# Patient Record
Sex: Male | Born: 1940 | Race: White | Hispanic: No | Marital: Married | Smoking: Former smoker
Health system: Southern US, Community
[De-identification: ages and names within clinical notes are randomized; demographics above are authoritative.]

## PROBLEM LIST (undated history)

## (undated) DIAGNOSIS — Z972 Presence of dental prosthetic device (complete) (partial): Secondary | ICD-10-CM

## (undated) DIAGNOSIS — I493 Ventricular premature depolarization: Secondary | ICD-10-CM

## (undated) DIAGNOSIS — Z8489 Family history of other specified conditions: Secondary | ICD-10-CM

## (undated) DIAGNOSIS — I4892 Unspecified atrial flutter: Secondary | ICD-10-CM

## (undated) DIAGNOSIS — I4510 Unspecified right bundle-branch block: Secondary | ICD-10-CM

## (undated) DIAGNOSIS — M1990 Unspecified osteoarthritis, unspecified site: Secondary | ICD-10-CM

## (undated) DIAGNOSIS — N400 Enlarged prostate without lower urinary tract symptoms: Secondary | ICD-10-CM

## (undated) DIAGNOSIS — G2581 Restless legs syndrome: Secondary | ICD-10-CM

## (undated) DIAGNOSIS — K219 Gastro-esophageal reflux disease without esophagitis: Secondary | ICD-10-CM

## (undated) HISTORY — DX: Unspecified atrial flutter: I48.92

## (undated) HISTORY — DX: Unspecified right bundle-branch block: I45.10

## (undated) HISTORY — PX: COLONOSCOPY: SHX174

## (undated) HISTORY — DX: Ventricular premature depolarization: I49.3

## (undated) HISTORY — DX: Benign prostatic hyperplasia without lower urinary tract symptoms: N40.0

## (undated) HISTORY — DX: Restless legs syndrome: G25.81

---

## 2004-12-16 ENCOUNTER — Ambulatory Visit: Payer: Self-pay | Admitting: Unknown Physician Specialty

## 2005-12-01 ENCOUNTER — Ambulatory Visit: Payer: Self-pay | Admitting: General Surgery

## 2005-12-01 HISTORY — PX: HERNIA REPAIR: SHX51

## 2008-06-04 ENCOUNTER — Ambulatory Visit: Payer: Self-pay | Admitting: Unknown Physician Specialty

## 2008-09-07 ENCOUNTER — Ambulatory Visit: Payer: Self-pay | Admitting: Internal Medicine

## 2009-05-08 ENCOUNTER — Ambulatory Visit: Payer: Self-pay | Admitting: Urology

## 2010-12-24 IMAGING — US US RENAL KIDNEY
1 series · 17 of 25 positions shown · non-contrast
Comparison: none

REASON FOR EXAM: UTI
COMMENTS:

[Series 1: us renal kidney · 17 of 25 slices shown]
[im 1/25]
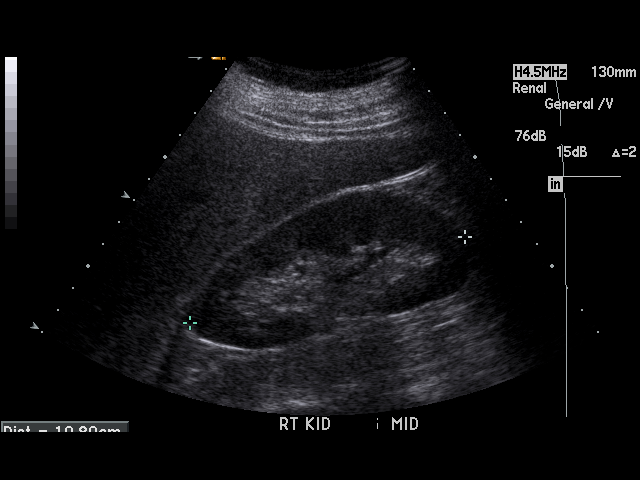
[im 3/25]
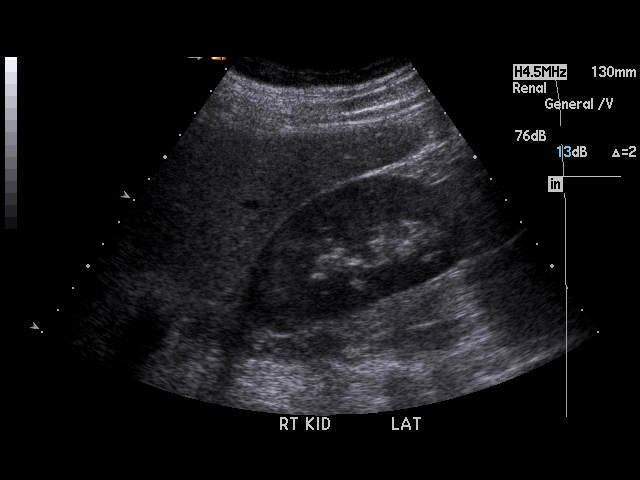
[im 4/25]
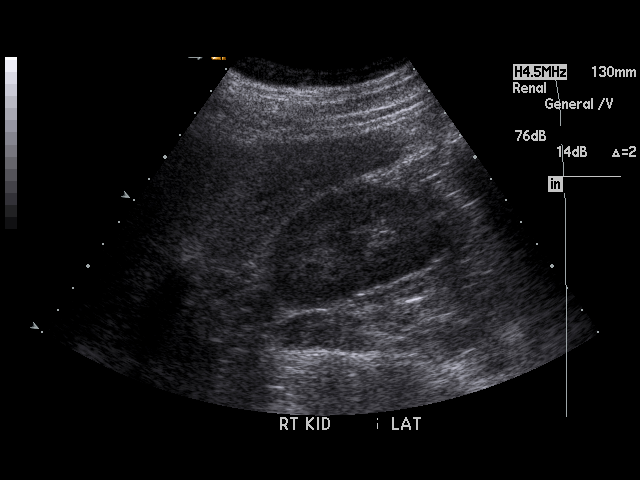
[im 6/25]
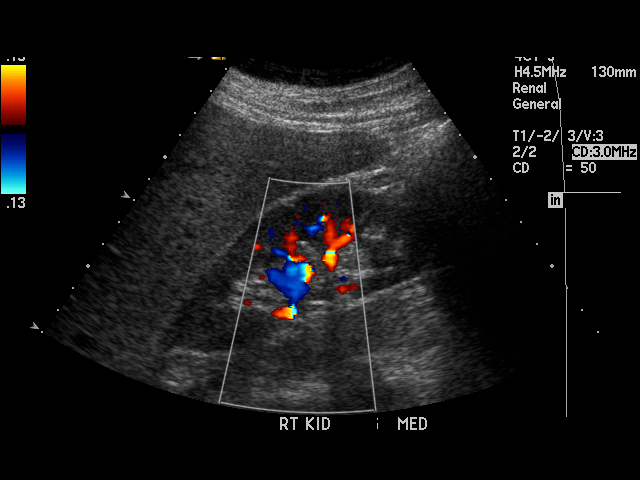
[im 7/25]
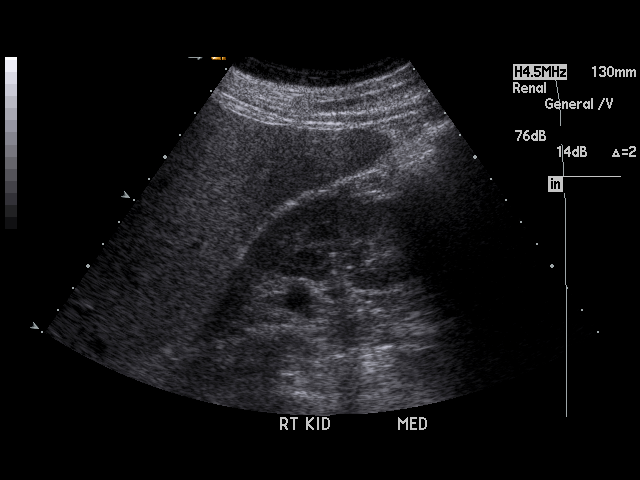
[im 9/25]
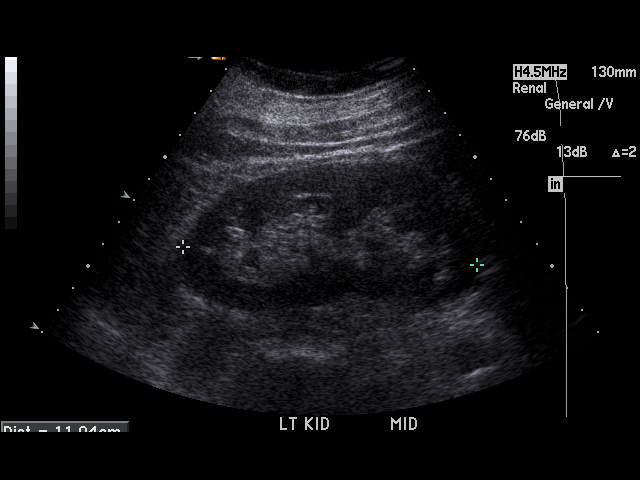
[im 10/25]
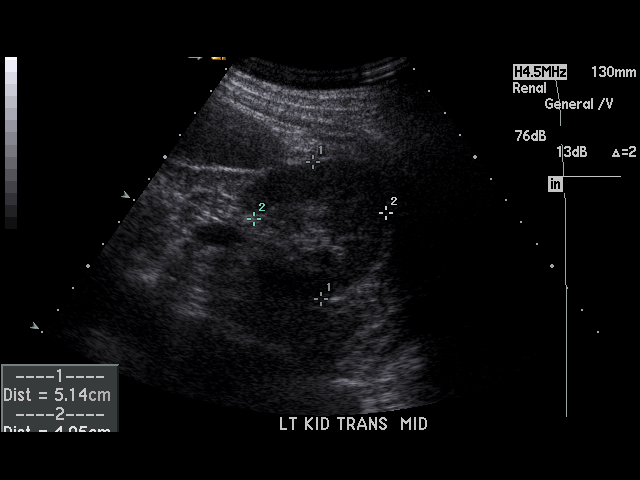
[im 12/25]
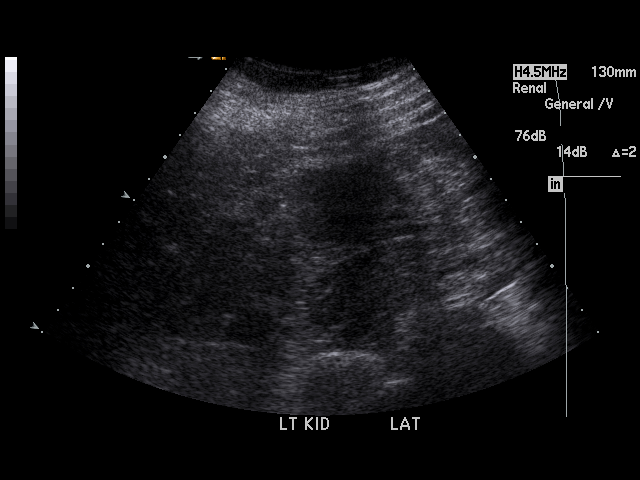
[im 13/25]
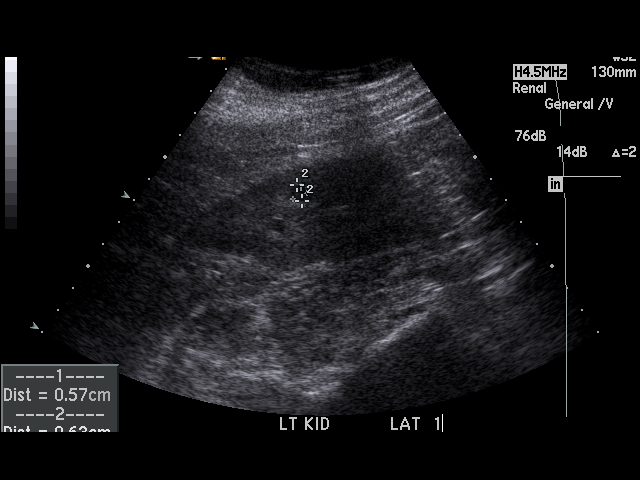
[im 14/25]
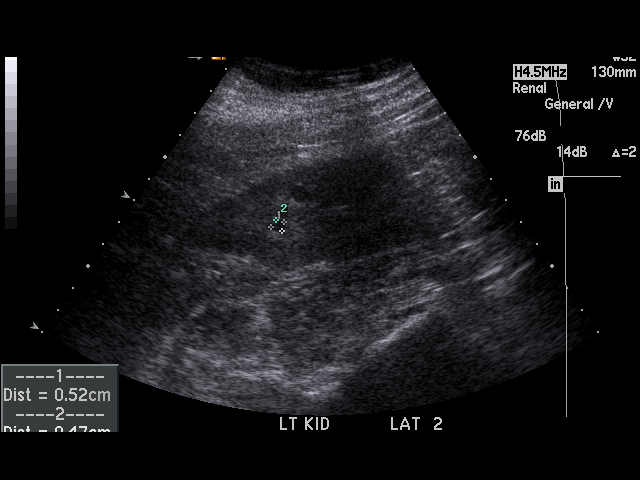
[im 16/25]
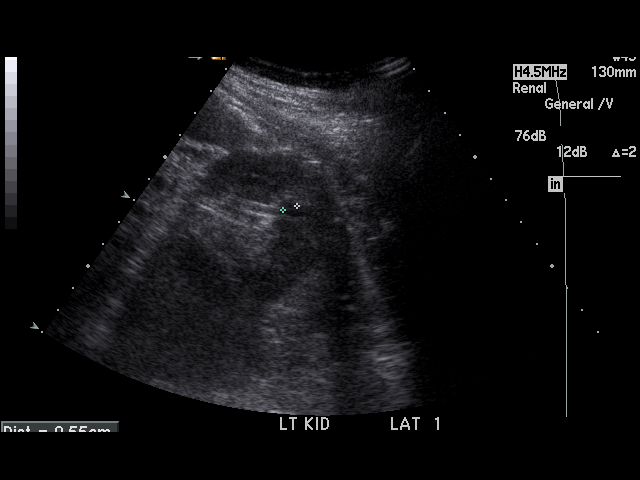
[im 17/25]
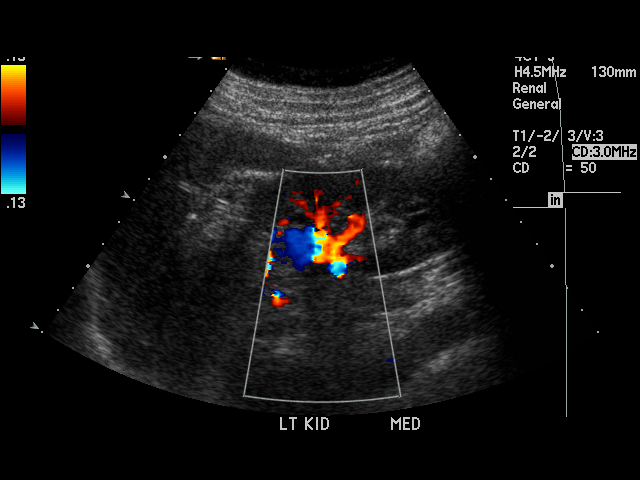
[im 19/25]
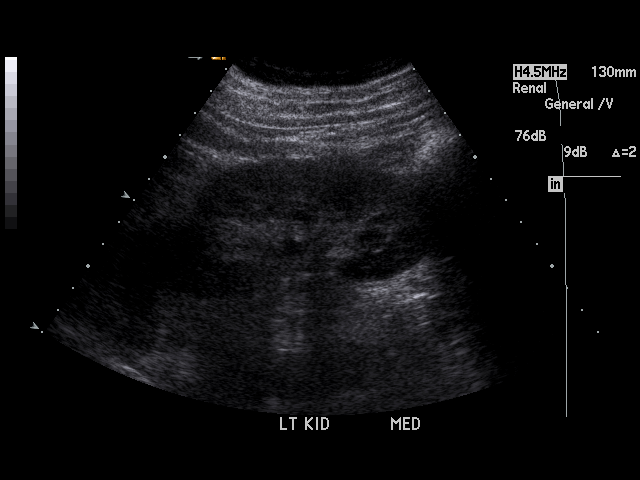
[im 20/25]
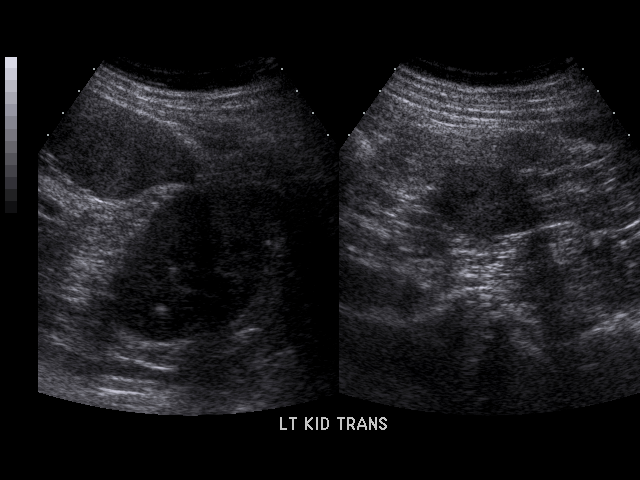
[im 22/25]
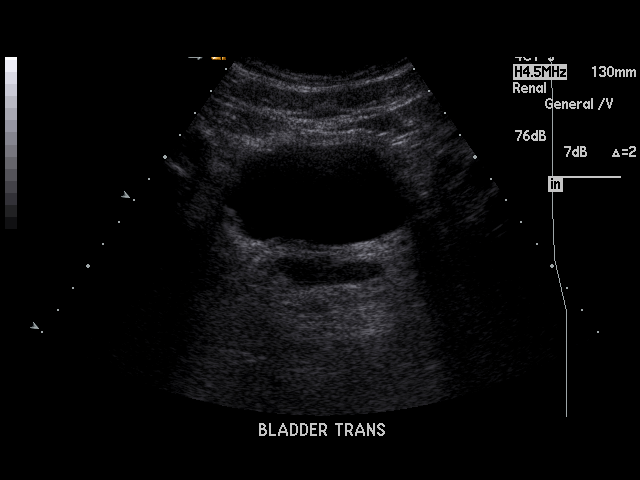
[im 23/25]
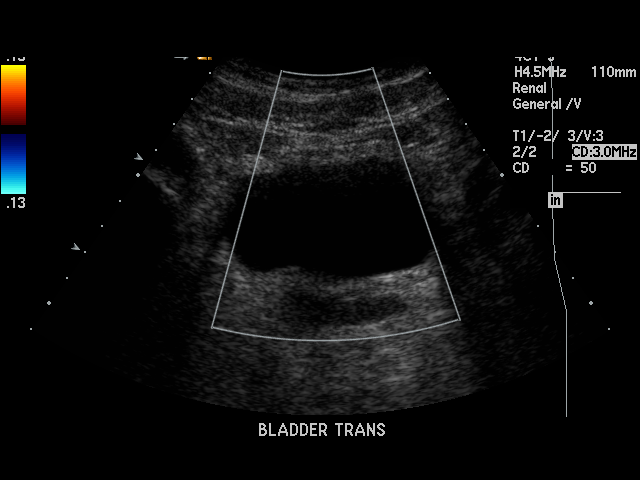
[im 25/25]
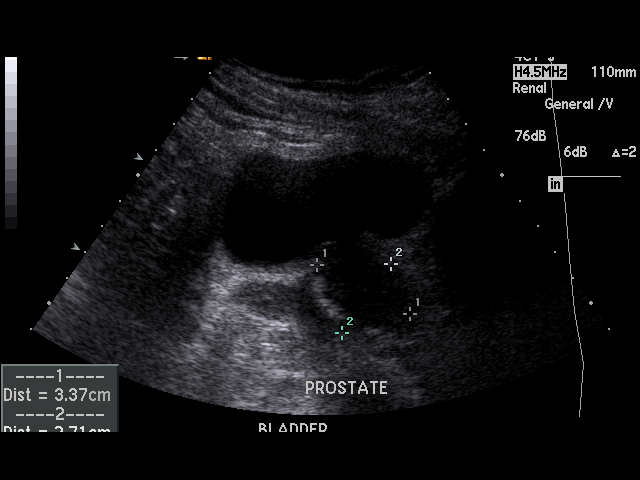

[17 of 25 positions shown; findings below may reference images not displayed]

PROCEDURE:     US  - US KIDNEY  - May 08, 2009 [DATE]

RESULT:     The right kidney measures 10.8 cm x 4.81 cm x 5.62 cm and the
left kidney measures 11.04 cm x 5.14 cm x 4.95 cm. The renal cortical
margins are bilaterally smooth. There are observed two, tiny cysts of the
left kidney with the larger measuring 7.5 mm in diameter. No solid renal
mass lesions are seen on either side. No renal calcifications are observed.
There is no hydronephrosis. The visualized portion of the urinary bladder is
normal in appearance. There is slight indentation of the base of the urinary
bladder by a mildly prominent prostate which measures 3.75 cm x 3.37 cm x
2.71 cm.
IMPRESSION: 1.  No hydronephrosis or other acute change is identified.
2.  Incidental note is made of a few tiny cysts of the left kidney.
3.  There is slight indentation of the base of the bladder by the prostate
which measures 3.75 cm at maximum diameter.
4.  The visualized portion of the urinary bladder otherwise is normal in
appearance.

## 2011-10-05 ENCOUNTER — Ambulatory Visit: Payer: Self-pay | Admitting: Unknown Physician Specialty

## 2011-10-08 LAB — PATHOLOGY REPORT

## 2012-06-29 DIAGNOSIS — R972 Elevated prostate specific antigen [PSA]: Secondary | ICD-10-CM | POA: Insufficient documentation

## 2014-06-06 DIAGNOSIS — M1991 Primary osteoarthritis, unspecified site: Secondary | ICD-10-CM | POA: Insufficient documentation

## 2014-12-30 ENCOUNTER — Encounter: Payer: Self-pay | Admitting: *Deleted

## 2015-01-09 NOTE — Discharge Instructions (Signed)
[facility] °[facility] °ENDOSCOPIC SINUS SURGERY °[facility] ° °What is Functional Endoscopic Sinus Surgery? ° The Surgery involves making the natural openings of the sinuses larger by removing the bony partitions that separate the sinuses from the nasal cavity.  The natural sinus lining is preserved as much as possible to allow the sinuses to resume normal function after the surgery.  In some patients nasal polyps (excessively swollen lining of the sinuses) may be removed to relieve obstruction of the sinus openings.  The surgery is performed through the nose using lighted scopes, which eliminates the need for incisions on the face.  A septoplasty is a different procedure which is sometimes performed with sinus surgery.  It involves straightening the boy partition that separates the two sides of your nose.  A crooked or deviated septum may need repair if is obstructing the sinuses or nasal airflow.  Turbinate reduction is also often performed during sinus surgery.  The turbinates are bony proturberances from the side walls of the nose which swell and can obstruct the nose in patients with sinus and allergy problems.  Their size can be surgically reduced to help relieve nasal obstruction. ° °What Can Sinus Surgery Do For Me? ° Sinus surgery can reduce the frequency of sinus infections requiring antibiotic treatment.  This can provide improvement in nasal congestion, post-nasal drainage, facial pressure and nasal obstruction.  Surgery will NOT prevent you from ever having an infection again, so it usually only for patients who get infections 4 or more times yearly requiring antibiotics, or for infections that do not clear with antibiotics.  It will not cure nasal allergies, so patients with allergies may still require medication to treat their allergies after surgery. Surgery may improve headaches related to sinusitis, however, some people will continue to  require medication to control sinus headaches related to allergies.  Surgery will do nothing for other forms of headache (migraine, tension or cluster). ° °What Are the Risks of Endoscopic Sinus Surgery? ° Current techniques allow surgery to be performed safely with little risk, however, there are rare complications that patients should be aware of.  Because the sinuses are located around the eyes, there is risk of eye injury, including blindness, though again, this would be quite rare. This is usually a result of bleeding behind the eye during surgery, which puts the vision oat risk, though there are treatments to protect the vision and prevent permanent disrupted by surgery causing a leak of the spinal fluid that surrounds the brain.  More serious complications would include bleeding inside the brain cavity or damage to the brain.  Again, all of these complications are uncommon, and spinal fluid leaks can be safely managed surgically if they occur.  The most common complication of sinus surgery is bleeding from the nose, which may require packing or cauterization of the nose.  Continued sinus have polyps may experience recurrence of the polyps requiring revision surgery.  Alterations of sense of smell or injury to the tear ducts are also rare complications.  ° °What is the Surgery Like, and what is the Recovery? ° The Surgery usually takes a couple of hours to perform, and is usually performed under a general anesthetic (completely asleep).  Patients are usually discharged home after a couple of hours.  Sometimes during surgery it is necessary to pack the nose to control bleeding, and the packing is left in place for 24 - 48 hours, and removed by your surgeon.    If a septoplasty was performed during the procedure, there is often a splint placed which must be removed after 5-7 days.   °Discomfort: Pain is usually mild to moderate, and can be controlled by prescription pain medication or acetaminophen (Tylenol).   Aspirin, Ibuprofen (Advil, Motrin), or Naprosyn (Aleve) should be avoided, as they can cause increased bleeding.  Most patients feel sinus pressure like they have a bad head cold for several days.  Sleeping with your head elevated can help reduce swelling and facial pressure, as can ice packs over the face.  A humidifier may be helpful to keep the mucous and blood from drying in the nose.  ° °Diet: There are no specific diet restrictions, however, you should generally start with clear liquids and a light diet of bland foods because the anesthetic can cause some nausea.  Advance your diet depending on how your stomach feels.  Taking your pain medication with food will often help reduce stomach upset which pain medications can cause. ° °Nasal Saline Irrigation: It is important to remove blood clots and dried mucous from the nose as it is healing.  This is done by having you irrigate the nose at least 3 - 4 times daily with a salt water solution.  We recommend using NeilMed Sinus Rinse (available at the drug store).  Fill the squeeze bottle with the solution, bend over a sink, and insert the tip of the squeeze bottle into the nose ½ of an inch.  Point the tip of the squeeze bottle towards the inside corner of the eye on the same side your irrigating.  Squeeze the bottle and gently irrigate the nose.  If you bend forward as you do this, most of the fluid will flow back out of the nose, instead of down your throat.   The solution should be warm, near body temperature, when you irrigate.   Each time you irrigate, you should use a full squeeze bottle.  ° °Note that if you are instructed to use Nasal Steroid Sprays at any time after your surgery, irrigate with saline BEFORE using the steroid spray, so you do not wash it all out of the nose. °Another product, Nasal Saline Gel (such as AYR Nasal Saline Gel) can be applied in each nostril 3 - 4 times daily to moisture the nose and reduce scabbing or crusting. ° °Bleeding:   Bloody drainage from the nose can be expected for several days, and patients are instructed to irrigate their nose frequently with salt water to help remove mucous and blood clots.  The drainage may be dark red or brown, though some fresh blood may be seen intermittently, especially after irrigation.  Do not blow you nose, as bleeding may occur. If you must sneeze, keep your mouth open to allow air to escape through your mouth. ° °If heavy bleeding occurs: Irrigate the nose with saline to rinse out clots, then spray the nose 3 - 4 times with Afrin Nasal Decongestant Spray.  The spray will constrict the blood vessels to slow bleeding.  Pinch the lower half of your nose shut to apply pressure, and lay down with your head elevated.  Ice packs over the nose may help as well. If bleeding persists despite these measures, you should notify your doctor.  Do not use the Afrin routinely to control nasal congestion after surgery, as it can result in worsening congestion and may affect healing.  ° ° ° °Activity: Return to work varies among patients. Most patients will be   out of work at least 5 - 7 days to recover.  Patient may return to work after they are off of narcotic pain medication, and feeling well enough to perform the functions of their job.  Patients must avoid heavy lifting (over 10 pounds) or strenuous physical for 2 weeks after surgery, so your employer may need to assign you to light duty, or keep you out of work longer if light duty is not possible.  NOTE: you should not drive, operate dangerous machinery, do any mentally demanding tasks or make any important legal or financial decisions while on narcotic pain medication and recovering from the general anesthetic.  °  °Call Your Doctor Immediately if You Have Any of the Following: °1. Bleeding that you cannot control with the above measures °2. Loss of vision, double vision, bulging of the eye or black eyes. °3. Fever over 101 degrees °4. Neck stiffness with  severe headache, fever, nausea and change in mental state. °You are always encourage to call anytime with concerns, however, please call with requests for pain medication refills during office hours. ° °Office Endoscopy: During follow-up visits your doctor will remove any packing or splints that may have been placed and evaluate and clean your sinuses endoscopically.  Topical anesthetic will be used to make this as comfortable as possible, though you may want to take your pain medication prior to the visit.  How often this will need to be done varies from patient to patient.  After complete recovery from the surgery, you may need follow-up endoscopy from time to time, particularly if there is concern of recurrent infection or nasal polyps. ° °General Anesthesia, Adult, Care After °Refer to this sheet in the next few weeks. These instructions provide you with information on caring for yourself after your procedure. Your health care provider may also give you more specific instructions. Your treatment has been planned according to current medical practices, but problems sometimes occur. Call your health care provider if you have any problems or questions after your procedure. °WHAT TO EXPECT AFTER THE PROCEDURE °After the procedure, it is typical to experience: °· Sleepiness. °· Nausea and vomiting. °HOME CARE INSTRUCTIONS °· For the first 24 hours after general anesthesia: °¨ Have a responsible person with you. °¨ Do not drive a car. If you are alone, do not take public transportation. °¨ Do not drink alcohol. °¨ Do not take medicine that has not been prescribed by your health care provider. °¨ Do not sign important papers or make important decisions. °¨ You may resume a normal diet and activities as directed by your health care provider. °· Change bandages (dressings) as directed. °· If you have questions or problems that seem related to general anesthesia, call the hospital and ask for the anesthetist or  anesthesiologist on call. °SEEK MEDICAL CARE IF: °· You have nausea and vomiting that continue the day after anesthesia. °· You develop a rash. °SEEK IMMEDIATE MEDICAL CARE IF:  °· You have difficulty breathing. °· You have chest pain. °· You have any allergic problems. °  °This information is not intended to replace advice given to you by your health care provider. Make sure you discuss any questions you have with your health care provider. °  °Document Released: 05/03/2000 Document Revised: 02/15/2014 Document Reviewed: 05/26/2011 °Elsevier Interactive Patient Education ©2016 [employer] ° °

## 2015-01-10 ENCOUNTER — Ambulatory Visit: Payer: Medicare Other | Admitting: Anesthesiology

## 2015-01-10 ENCOUNTER — Encounter
Admission: RE | Disposition: A | Discharge status: Home / Self Care | Payer: Self-pay | Source: Ambulatory Visit | Attending: Unknown Physician Specialty

## 2015-01-10 ENCOUNTER — Ambulatory Visit
Admission: RE | Admit: 2015-01-10 | Discharge: 2015-01-10 | Disposition: A | Discharge status: Home / Self Care | Payer: Medicare Other | Source: Ambulatory Visit | Attending: Unknown Physician Specialty | Admitting: Unknown Physician Specialty

## 2015-01-10 DIAGNOSIS — K219 Gastro-esophageal reflux disease without esophagitis: Secondary | ICD-10-CM | POA: Insufficient documentation

## 2015-01-10 DIAGNOSIS — Z87891 Personal history of nicotine dependence: Secondary | ICD-10-CM | POA: Diagnosis not present

## 2015-01-10 DIAGNOSIS — J343 Hypertrophy of nasal turbinates: Secondary | ICD-10-CM | POA: Diagnosis not present

## 2015-01-10 DIAGNOSIS — Z79899 Other long term (current) drug therapy: Secondary | ICD-10-CM | POA: Insufficient documentation

## 2015-01-10 DIAGNOSIS — M1990 Unspecified osteoarthritis, unspecified site: Secondary | ICD-10-CM | POA: Insufficient documentation

## 2015-01-10 DIAGNOSIS — J342 Deviated nasal septum: Secondary | ICD-10-CM | POA: Diagnosis not present

## 2015-01-10 DIAGNOSIS — J3489 Other specified disorders of nose and nasal sinuses: Secondary | ICD-10-CM | POA: Insufficient documentation

## 2015-01-10 HISTORY — PX: SEPTOPLASTY: SHX2393

## 2015-01-10 HISTORY — PX: NASAL TURBINATE REDUCTION: SHX2072

## 2015-01-10 HISTORY — DX: Unspecified osteoarthritis, unspecified site: M19.90

## 2015-01-10 HISTORY — DX: Presence of dental prosthetic device (complete) (partial): Z97.2

## 2015-01-10 HISTORY — DX: Gastro-esophageal reflux disease without esophagitis: K21.9

## 2015-01-10 SURGERY — SEPTOPLASTY, NOSE
Anesthesia: General | Site: Nose | Wound class: Clean Contaminated

## 2015-01-10 MED ORDER — ONDANSETRON HCL 4 MG/2ML IJ SOLN
INTRAMUSCULAR | Status: DC | PRN
  Administered 2015-01-10: 4 mg via INTRAVENOUS

## 2015-01-10 MED ORDER — PHENYLEPHRINE HCL 0.5 % NA SOLN
NASAL | Status: DC | PRN
  Administered 2015-01-10: 30 mL via TOPICAL

## 2015-01-10 MED ORDER — EPHEDRINE SULFATE 50 MG/ML IJ SOLN
INTRAMUSCULAR | Status: DC | PRN
  Administered 2015-01-10 (×4): 5 mg via INTRAVENOUS

## 2015-01-10 MED ORDER — OXYCODONE HCL 5 MG PO TABS: 5.0000 mg | ORAL_TABLET | Freq: Once | ORAL | Status: DC | PRN

## 2015-01-10 MED ORDER — ONDANSETRON HCL 4 MG/2ML IJ SOLN: 4.0000 mg | Freq: Once | INTRAMUSCULAR | Status: DC | PRN

## 2015-01-10 MED ORDER — MIDAZOLAM HCL 5 MG/5ML IJ SOLN
INTRAMUSCULAR | Status: DC | PRN
  Administered 2015-01-10: 2 mg via INTRAVENOUS

## 2015-01-10 MED ORDER — ACETAMINOPHEN 325 MG PO TABS: 325.0000 mg | ORAL_TABLET | ORAL | Status: DC | PRN

## 2015-01-10 MED ORDER — LACTATED RINGERS IV SOLN
INTRAVENOUS | Status: DC
  Administered 2015-01-10: 12:00:00 via INTRAVENOUS

## 2015-01-10 MED ORDER — ACETAMINOPHEN 160 MG/5ML PO SOLN: 325.0000 mg | ORAL | Status: DC | PRN

## 2015-01-10 MED ORDER — LIDOCAINE-EPINEPHRINE 1 %-1:100000 IJ SOLN
INTRAMUSCULAR | Status: DC | PRN
  Administered 2015-01-10: 12 mL

## 2015-01-10 MED ORDER — GLYCOPYRROLATE 0.2 MG/ML IJ SOLN
INTRAMUSCULAR | Status: DC | PRN
  Administered 2015-01-10: 0.1 mg via INTRAVENOUS

## 2015-01-10 MED ORDER — SULFAMETHOXAZOLE-TRIMETHOPRIM 400-80 MG PO TABS: 1.0000 | ORAL_TABLET | Freq: Two times a day (BID) | ORAL | Status: DC

## 2015-01-10 MED ORDER — DEXAMETHASONE SODIUM PHOSPHATE 4 MG/ML IJ SOLN
INTRAMUSCULAR | Status: DC | PRN
  Administered 2015-01-10: 10 mg via INTRAVENOUS

## 2015-01-10 MED ORDER — HYDROMORPHONE HCL 1 MG/ML IJ SOLN: 0.2500 mg | INTRAMUSCULAR | Status: DC | PRN

## 2015-01-10 MED ORDER — OXYMETAZOLINE HCL 0.05 % NA SOLN
6.0000 | Freq: Once | NASAL | Status: AC
  Administered 2015-01-10: 6 via NASAL

## 2015-01-10 MED ORDER — HYDROCODONE-ACETAMINOPHEN 5-300 MG PO TABS: 1.0000 | ORAL_TABLET | ORAL | Status: DC | PRN

## 2015-01-10 MED ORDER — LIDOCAINE HCL (CARDIAC) 20 MG/ML IV SOLN
INTRAVENOUS | Status: DC | PRN
  Administered 2015-01-10: 40 mg via INTRAVENOUS

## 2015-01-10 MED ORDER — OXYCODONE HCL 5 MG/5ML PO SOLN: 5.0000 mg | Freq: Once | ORAL | Status: DC | PRN

## 2015-01-10 MED ORDER — ROCURONIUM BROMIDE 100 MG/10ML IV SOLN
INTRAVENOUS | Status: DC | PRN
  Administered 2015-01-10: 30 mg via INTRAVENOUS

## 2015-01-10 MED ORDER — PROPOFOL 10 MG/ML IV BOLUS
INTRAVENOUS | Status: DC | PRN
  Administered 2015-01-10: 200 mg via INTRAVENOUS

## 2015-01-10 MED ORDER — FENTANYL CITRATE (PF) 100 MCG/2ML IJ SOLN
INTRAMUSCULAR | Status: DC | PRN
  Administered 2015-01-10: 50 ug via INTRAVENOUS

## 2015-01-10 SURGICAL SUPPLY — 30 items
BLADE SURG 15 STRL LF DISP TIS (BLADE) IMPLANT
BLADE SURG 15 STRL SS (BLADE)
COAG SUCT 10F 3.5MM HAND CTRL (MISCELLANEOUS) ×4 IMPLANT
DRAPE HEAD BAR (DRAPES) ×4 IMPLANT
DRESSING NASL FOAM PST OP SINU (MISCELLANEOUS) ×4 IMPLANT
DRSG NASAL FOAM POST OP SINU (MISCELLANEOUS) ×8
GLOVE BIO SURGEON STRL SZ7.5 (GLOVE) ×12 IMPLANT
HANDLE YANKAUER SUCT BULB TIP (MISCELLANEOUS) ×4 IMPLANT
KIT ROOM TURNOVER OR (KITS) ×4 IMPLANT
NEEDLE HYPO 25GX1X1/2 BEV (NEEDLE) ×4 IMPLANT
NS IRRIG 500ML POUR BTL (IV SOLUTION) IMPLANT
PACK DRAPE NASAL/ENT (PACKS) ×4 IMPLANT
PAD GROUND ADULT SPLIT (MISCELLANEOUS) ×4 IMPLANT
SOL ANTI-FOG 6CC FOG-OUT (MISCELLANEOUS) ×2 IMPLANT
SOL FOG-OUT ANTI-FOG 6CC (MISCELLANEOUS) ×2
SPLINT NASAL SEPTAL BLV .25 LG (MISCELLANEOUS) IMPLANT
SPLINT NASAL SEPTAL BLV .50 ST (MISCELLANEOUS) ×4 IMPLANT
SPONGE NEURO XRAY DETECT 1X3 (DISPOSABLE) ×4 IMPLANT
STRAP BODY AND KNEE 60X3 (MISCELLANEOUS) ×4 IMPLANT
SUT CHROMIC 3-0 (SUTURE) ×2
SUT CHROMIC 3-0 KS 27XMFL CR (SUTURE) ×2
SUT CHROMIC 5-0 (SUTURE)
SUT CHROMIC 5-0 P2 18XMFL CR (SUTURE)
SUT ETHILON 3-0 KS 30 BLK (SUTURE) ×4 IMPLANT
SUT PLAIN GUT 4-0 (SUTURE) IMPLANT
SUTURE CHRMC 3-0 KS 27XMFL CR (SUTURE) ×2 IMPLANT
SUTURE CHRMC 5-0 P2 18XMF CR (SUTURE) IMPLANT
SYRINGE 10CC LL (SYRINGE) ×4 IMPLANT
TOWEL OR 17X26 4PK STRL BLUE (TOWEL DISPOSABLE) ×4 IMPLANT
WATER STERILE IRR 500ML POUR (IV SOLUTION) ×4 IMPLANT

## 2015-01-10 NOTE — Anesthesia Preprocedure Evaluation (Signed)
Anesthesia Evaluation  Patient identified by MRN, date of birth, ID band Patient awake    Reviewed: Allergy & Precautions, H&P , NPO status , Patient's Chart, lab work & pertinent test results, reviewed documented beta blocker date and time   Airway Mallampati: II  TM Distance: >3 FB Neck ROM: full    Dental  (+) Partial Upper   Pulmonary neg pulmonary ROS, former smoker,    Pulmonary exam normal breath sounds clear to auscultation       Cardiovascular Exercise Tolerance: Good negative cardio ROS   Rhythm:regular Rate:Normal     Neuro/Psych negative neurological ROS  negative psych ROS   GI/Hepatic negative GI ROS, Neg liver ROS,   Endo/Other  negative endocrine ROS  Renal/GU negative Renal ROS  negative genitourinary   Musculoskeletal   Abdominal   Peds  Hematology negative hematology ROS (+)   Anesthesia Other Findings   Reproductive/Obstetrics negative OB ROS                             Anesthesia Physical Anesthesia Plan  ASA: II  Anesthesia Plan: General   Post-op Pain Management:    Induction: Intravenous  Airway Management Planned: Oral ETT  Additional Equipment:   Intra-op Plan:   Post-operative Plan: Extubation in OR  Informed Consent: I have reviewed the patients History and Physical, chart, labs and discussed the procedure including the risks, benefits and alternatives for the proposed anesthesia with the patient or authorized representative who has indicated his/her understanding and acceptance.   Dental Advisory Given  Plan Discussed with: CRNA  Anesthesia Plan Comments:         Anesthesia Quick Evaluation

## 2015-01-10 NOTE — Op Note (Signed)
PREOPERATIVE DIAGNOSIS:  Chronic nasal obstruction.  POSTOPERATIVE DIAGNOSIS:  Chronic nasal obstruction.  SURGEON:  Roena Malady, M.D.  NAME OF PROCEDURE:  1. Nasal septoplasty. 2. Submucous resection of inferior turbinates.  OPERATIVE FINDINGS:  Severe nasal septal deformity, hypertrophy of the inferior turbinates.   DESCRIPTION OF THE PROCEDURE:  Christopher Aguirre was identified in the holding area and taken to the operating room and placed in the supine position.  After general endotracheal anesthesia was induced, the table was turned 45 degrees and the patient was placed in a semi-Fowler position.  The nose was then topically anesthetized with Lidocaine, cotton pledgets were placed within each nostril. After approximately 5 minutes, this was removed at which time a local anesthetic of 1% Lidocaine 1:100,000 units of Epinephrine was used to inject the inferior turbinates in the nasal septum. A total of 47ml ml was used. Examination of the nose showed a severe left nasal septal deformity and tremendous hypertrophied inferior turbinate.  Beginning on the right hand side a hemitransfixion incision was then created on the leading edge of the septum on the right.  A subperichondrial plane was elevated posteriorly on the left and taken back to the perpendicular plate of the ethmoid where subperiosteal plane was elevated posteriorly on the left. A large septal spur was identified on the left hand side impacting on the inferior turbinate.  An inferior rim of cartilage was removed anteriorly with care taken to leave an anterior strut to prevent nasal collapse. With this strut removed the perpendicular plate of the ethmoid was separated from the quadrangular cartilage. The large septal spur was removed.  The septum was then replaced in the midline. Reinspection through each nostril showed excellent reduction of the septal deformity. A left posterior inferior fenestration was then created to allow hematoma  drainage.  With the septoplasty completed, beginning on the left-hand side, a 15 blade was used to incise along the inferior edge of the inferior turbinate. A superior laterally based flap was then elevated. The underlying conchal bone of mucosa was excised using Knight scissors. The flap was then laid back over the turbinate stump and cauterized using suction cautery. In a similar fashion the submucous resection was performed on the right.  With the submucous resection completed bilaterally and no active bleeding, the hemitransfixion incision was then closed using two interrupted 3-0 chromic sutures.  Plastic nasal septal splints were placed within each nostril and affixed to the septum using a 3-0 nylon suture. Stammberger was then used beneath each inferior turbinate for hemostasis.    The patient tolerated the procedure well, was returned to anesthesia, extubated in the operating room, and taken to the recovery room in stable condition.    CULTURES:  None.  SPECIMENS:  None.  ESTIMATED BLOOD LOSS:  25 cc.  Channing Savich T  01/10/2015  12:55 PM

## 2015-01-10 NOTE — Anesthesia Postprocedure Evaluation (Signed)
Anesthesia Post Note  Patient: Christopher Aguirre  Procedure(s) Performed: Procedure(s) (LRB): SEPTOPLASTY (N/A) TURBINATE REDUCTION/SUBMUCOSAL  (Bilateral)  Patient location during evaluation: PACU Anesthesia Type: General Level of consciousness: awake and alert Pain management: pain level controlled Vital Signs Assessment: post-procedure vital signs reviewed and stable Respiratory status: spontaneous breathing, nonlabored ventilation and respiratory function stable Cardiovascular status: blood pressure returned to baseline and stable Postop Assessment: no signs of nausea or vomiting Anesthetic complications: no    Trecia Rogers

## 2015-01-10 NOTE — Anesthesia Procedure Notes (Signed)
Procedure Name: Intubation Date/Time: 01/10/2015 12:22 PM Performed by: Londell Moh Pre-anesthesia Checklist: Patient identified, Emergency Drugs available, Suction available, Patient being monitored and Timeout performed Patient Re-evaluated:Patient Re-evaluated prior to inductionOxygen Delivery Method: Circle system utilized Preoxygenation: Pre-oxygenation with 100% oxygen Intubation Type: IV induction Ventilation: Mask ventilation without difficulty Laryngoscope Size: Mac and 3 Grade View: Grade I Tube type: Oral Rae Tube size: 7.5 mm Number of attempts: 1 Placement Confirmation: ETT inserted through vocal cords under direct vision,  positive ETCO2 and breath sounds checked- equal and bilateral Tube secured with: Tape Dental Injury: Teeth and Oropharynx as per pre-operative assessment

## 2015-01-10 NOTE — H&P (Signed)
  H+P  Reviewed and will be scanned in later. No changes noted. 

## 2015-01-10 NOTE — Transfer of Care (Signed)
Immediate Anesthesia Transfer of Care Note  Patient: Christopher Aguirre  Procedure(s) Performed: Procedure(s): SEPTOPLASTY (N/A) TURBINATE REDUCTION/SUBMUCOSAL  (Bilateral)  Patient Location: PACU  Anesthesia Type: General  Level of Consciousness: awake, alert  and patient cooperative  Airway and Oxygen Therapy: Patient Spontanous Breathing and Patient connected to supplemental oxygen  Post-op Assessment: Post-op Vital signs reviewed, Patient's Cardiovascular Status Stable, Respiratory Function Stable, Patent Airway and No signs of Nausea or vomiting  Post-op Vital Signs: Reviewed and stable  Complications: No apparent anesthesia complications

## 2015-01-13 ENCOUNTER — Encounter: Payer: Self-pay | Admitting: Unknown Physician Specialty

## 2017-01-27 DIAGNOSIS — R21 Rash and other nonspecific skin eruption: Secondary | ICD-10-CM | POA: Insufficient documentation

## 2017-08-15 ENCOUNTER — Encounter: Payer: Self-pay | Admitting: *Deleted

## 2017-08-25 ENCOUNTER — Encounter: Payer: Self-pay | Admitting: General Surgery

## 2017-08-25 ENCOUNTER — Ambulatory Visit (INDEPENDENT_AMBULATORY_CARE_PROVIDER_SITE_OTHER): Payer: Medicare Other | Admitting: General Surgery

## 2017-08-25 VITALS — BP 134/86 | HR 81 | Resp 14 | Ht 72.0 in | Wt 198.0 lb

## 2017-08-25 DIAGNOSIS — K439 Ventral hernia without obstruction or gangrene: Secondary | ICD-10-CM

## 2017-08-25 DIAGNOSIS — K4090 Unilateral inguinal hernia, without obstruction or gangrene, not specified as recurrent: Secondary | ICD-10-CM

## 2017-08-25 NOTE — Patient Instructions (Addendum)
Inguinal Hernia, Adult An inguinal hernia is when fat or the intestines push through the area where the leg meets the lower belly (groin) and make a rounded lump (bulge). This condition happens over time. There are three types of inguinal hernias. These types include:  Hernias that can be pushed back into the belly (are reducible).  Hernias that cannot be pushed back into the belly (are incarcerated).  Hernias that cannot be pushed back into the belly and lose their blood supply (get strangulated). This type needs emergency surgery.  Follow these instructions at home: Lifestyle  Drink enough fluid to keep your urine (pee) clear or pale yellow.  Eat plenty of fruits, vegetables, and whole grains. These have a lot of fiber. Talk with your doctor if you have questions.  Avoid lifting heavy objects.  Avoid standing for long periods of time.  Do not use tobacco products. These include cigarettes, chewing tobacco, or e-cigarettes. If you need help quitting, ask your doctor.  Try to stay at a healthy weight. General instructions  Do not try to force the hernia back in.  Watch your hernia for any changes in color or size. Let your doctor know if there are any changes.  Take over-the-counter and prescription medicines only as told by your doctor.  Keep all follow-up visits as told by your doctor. This is important. Contact a doctor if:  You have a fever.  You have new symptoms.  Your symptoms get worse. Get help right away if:  The area where the legs meets the lower belly has: ? Pain that gets worse suddenly. ? A bulge that gets bigger suddenly and does not go down. ? A bulge that turns red or purple. ? A bulge that is painful to the touch.  You are a man and your scrotum: ? Suddenly feels painful. ? Suddenly changes in size.  You feel sick to your stomach (nauseous) and this feeling does not go away.  You throw up (vomit) and this keeps happening.  You feel your heart  beating a lot more quickly than normal.  You cannot poop (have a bowel movement) or pass gas. This information is not intended to replace advice given to you by your health care provider. Make sure you discuss any questions you have with your health care provider. Document Released: 02/25/2006 Document Revised: 07/03/2015 Document Reviewed: 12/05/2013 Elsevier Interactive Patient Education  [employer].  The patient is scheduled for surgery at [facility] on 09/05/17. He will pre admit at the hospital. The patient is aware of date and instructions.

## 2017-08-25 NOTE — Progress Notes (Signed)
Patient ID: Christopher Aguirre, male   DOB: 1940-08-03, 77 y.o.   MRN: [id number]  Chief Complaint  Patient presents with  . Hernia    HPI Christopher Aguirre is a 77 y.o. male.  Patient here today for an evaluation of a possible right hernia.  He states that he has noticed a knot for about 3-4 weeks ago, worse with activity and standing.  It does seem to be causing some right groin pain.  He states that he had an UTI in February and it seemed to take a long time for him to feel better. No nausea, vomiting, constipation or diarrhea noted. He does admit to an enlarged prostate, urinates 2-3 times at night. 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  HPI  Past Medical History:  Diagnosis Date  . Arthritis    shoulder, collar bone  . GERD (gastroesophageal reflux disease)   . Prostate enlargement   . RLS (restless legs syndrome)   . Wears dentures    partial upper    Past Surgical History:  Procedure Laterality Date  . COLONOSCOPY  2015?   Dr Vira Agar  . HERNIA REPAIR Left 12/01/2005   Dr Bary Castilla  . NASAL TURBINATE REDUCTION Bilateral 01/10/2015   Procedure: TURBINATE REDUCTION/SUBMUCOSAL ;  Surgeon: Beverly Gust, MD;  Location: [facility];  Service: ENT;  Laterality: Bilateral;  . SEPTOPLASTY N/A 01/10/2015   Procedure: SEPTOPLASTY;  Surgeon: Beverly Gust, MD;  Location: [facility];  Service: ENT;  Laterality: N/A;    Family History  Problem Relation Age of Onset  . Aneurysm Father     Social History Social History   Tobacco Use  . Smoking status: Former Smoker    Packs/day: 1.00    Types: Cigarettes    Last attempt to quit: 02/09/2004    Years since quitting: 13.5  . Smokeless tobacco: Never Used  . Tobacco comment: quit 2006  Substance Use Topics  . Alcohol use: Yes    Alcohol/week: 4.2 oz    Types: 7 Cans of beer per week  . Drug use: Never    No Known Allergies  Current Outpatient Medications  Medication Sig Dispense  Refill  . finasteride (PROSCAR) 5 MG tablet Take 5 mg by mouth at bedtime.   1  . gabapentin (NEURONTIN) 100 MG capsule Take 100 mg by mouth 2 (two) times daily.     . meloxicam (MOBIC) 7.5 MG tablet Take 7.5 mg by mouth daily.     . Multiple Vitamin (MULTIVITAMIN) capsule Take 1 capsule by mouth daily at 6 PM.     . ranitidine (ZANTAC) 150 MG capsule Take 150 mg by mouth 2 (two) times daily.    . tamsulosin (FLOMAX) 0.4 MG CAPS capsule Take 0.4 mg by mouth daily.     Marland Kitchen acetaminophen (TYLENOL) 500 MG tablet Take 1,000 mg by mouth daily as needed for moderate pain or headache.    . Cholecalciferol (VITAMIN D) 2000 units tablet Take 2,000 Units by mouth every other day.    . Cyanocobalamin (B-12) 2500 MCG TABS Take 2,500 mcg by mouth every other day.    . diphenhydrAMINE (BENADRYL) 25 MG tablet Take 25 mg by mouth daily as needed for allergies.    . ferrous sulfate 325 (65 FE) MG tablet Take 325 mg by mouth daily.    Marland Kitchen rOPINIRole (REQUIP) 0.5 MG tablet Take 0.5 mg by mouth at bedtime as needed (for restless leg syndrome).    Marland Kitchen  zolpidem (AMBIEN) 5 MG tablet Take 2.5 mg by mouth at bedtime as needed for sleep.     No current facility-administered medications for this visit.     Review of Systems Review of Systems  Constitutional: Negative.   Respiratory: Negative.   Cardiovascular: Negative.     Blood pressure 134/86, pulse 81, resp. rate 14, height 6' (1.829 m), weight 198 lb (89.8 kg), SpO2 96 %.  Physical Exam Physical Exam  Constitutional: He is oriented to person, place, and time. He appears well-developed and well-nourished.  HENT:  Mouth/Throat: Oropharynx is clear and moist.  Eyes: Conjunctivae are normal. No scleral icterus.  Neck: Neck supple.  Cardiovascular: Normal rate, regular rhythm and normal heart sounds.  Pulmonary/Chest: Effort normal and breath sounds normal.  Abdominal: Soft. A hernia is present. Hernia confirmed positive in the right inguinal area.    Right  inguinal hernia with tenderness, possible epigastric lipoma vs hernia  Lymphadenopathy:    He has no cervical adenopathy.  Neurological: He is alert and oriented to person, place, and time.  Skin: Skin is warm and dry.  Left forearm lipoma  Psychiatric: His behavior is normal.    Data Reviewed CBC dated January 20, 2017 showed a hemoglobin of 15.4 with an MCV of 96.6, white blood cell count of 5400, platelet count of 188,000. Comprehensive metabolic panel of the same date was unremarkable.  Creatinine 1.2, estimated GFR 59.  Assessment    Symptomatic right inguinal hernia, status post left inguinal hernia repair.  Epigastric hernia.    Plan    Hernia precautions and incarceration were discussed with the patient. If they develop symptoms of an incarcerated hernia, they were encouraged to seek prompt medical attention.  I have recommended repair of the hernia using mesh (inguinal position) on an outpatient basis in the near future. The risk of infection was reviewed. The role of prosthetic mesh to minimize the risk of recurrence was reviewed.  The patient had an Ultra Pro mesh placed during his left inguinal hernia repair and is done well with this.  I do not anticipate the need for mesh repair of the epigastric area.     HPI, Physical Exam, Assessment and Plan have been scribed under the direction and in the presence of Robert Bellow, MD. Karie Fetch, RN  I have completed the exam and reviewed the above documentation for accuracy and completeness.  I agree with the above.  Haematologist has been used and any errors in dictation or transcription are unintentional.  Hervey Ard, M.D., F.A.C.S.  The patient is scheduled for surgery at [facility] on 09/05/17. He will pre admit at the hospital. The patient is aware of date and instructions.  Documented by Caryl-Lyn Otis Brace LPN  Forest Gleason Xitlalic Maslin 08/26/2017, 5:51 PM

## 2017-08-26 DIAGNOSIS — K4090 Unilateral inguinal hernia, without obstruction or gangrene, not specified as recurrent: Secondary | ICD-10-CM | POA: Insufficient documentation

## 2017-08-26 DIAGNOSIS — K439 Ventral hernia without obstruction or gangrene: Secondary | ICD-10-CM | POA: Insufficient documentation

## 2017-08-29 ENCOUNTER — Telehealth: Payer: Self-pay | Admitting: *Deleted

## 2017-08-29 ENCOUNTER — Inpatient Hospital Stay: Admission: RE | Admit: 2017-08-29 | Payer: Medicare Other | Source: Ambulatory Visit

## 2017-08-29 NOTE — Telephone Encounter (Signed)
Erin from Pre admit called and needs orders on this patient.

## 2017-08-31 ENCOUNTER — Other Ambulatory Visit: Payer: Self-pay

## 2017-08-31 ENCOUNTER — Encounter
Admission: RE | Admit: 2017-08-31 | Discharge: 2017-08-31 | Disposition: A | Discharge status: Home / Self Care | Payer: Medicare Other | Source: Ambulatory Visit | Attending: General Surgery | Admitting: General Surgery

## 2017-08-31 DIAGNOSIS — Z01812 Encounter for preprocedural laboratory examination: Secondary | ICD-10-CM | POA: Diagnosis not present

## 2017-08-31 DIAGNOSIS — Z01810 Encounter for preprocedural cardiovascular examination: Secondary | ICD-10-CM | POA: Insufficient documentation

## 2017-08-31 LAB — BASIC METABOLIC PANEL
Anion gap: 9 (ref 5–15)
BUN: 28 mg/dL — AB (ref 8–23)
CO2: 25 mmol/L (ref 22–32)
Calcium: 9.5 mg/dL (ref 8.9–10.3)
Chloride: 106 mmol/L (ref 98–111)
Creatinine, Ser: 1.04 mg/dL (ref 0.61–1.24)
GFR calc Af Amer: >60 mL/min (ref >60)
GLUCOSE: 120 mg/dL — AB (ref 70–99)
POTASSIUM: 4.1 mmol/L (ref 3.5–5.1)
SODIUM: 140 mmol/L (ref 135–145)

## 2017-08-31 LAB — CBC
HCT: 43.8 % (ref 40.0–52.0)
Hemoglobin: 15.4 g/dL (ref 13.0–18.0)
MCH: 33.4 pg (ref 26.0–34.0)
MCHC: 35.2 g/dL (ref 32.0–36.0)
MCV: 94.9 fL (ref 80.0–100.0)
PLATELETS: 191 10*3/uL (ref 150–440)
RBC: 4.62 MIL/uL (ref 4.40–5.90)
RDW: 13.3 % (ref 11.5–14.5)
WBC: 6.3 10*3/uL (ref 3.8–10.6)

## 2017-08-31 NOTE — Patient Instructions (Addendum)
Your procedure is scheduled on: Monday, July 29,2019  Report to [facility] ON THE SECOND FLOOR OF THE MEDICAL MALL.     DO NOT STOP ON THE FIRST FLOOR  To find out your arrival time please call [phone] between 1PM - 3PM on Friday, September 02, 2017  Remember: Instructions that are not followed completely may result in serious medical risk,  up to and including death, or upon the discretion of your surgeon and anesthesiologist your  surgery may need to be rescheduled.     _X__ 1. Do not eat food after midnight the night before your procedure.                 No gum chewing or hard candies.                  You may drink clear liquids up to 2 hours before you are scheduled to arrive for your surgery-                    DO not drink clear liquids within 2 hours of the start of your surgery.                  Clear Liquids include:  water, apple juice without pulp, clear carbohydrate                 drink such as Clearfast of Gatorade, Black Coffee or Tea (Do not add                 anything to coffee or tea).  __X__2.  On the morning of surgery brush your teeth with toothpaste and water,                    You may rinse your mouth with mouthwash if you wish.                      Do not swallow any toothpaste of mouthwash.     _X__ 3.  No Alcohol for 24 hours before or after surgery.   _X__ 4.  Do Not Smoke or use e-cigarettes For 24 Hours Prior to Your Surgery.                 Do not use any chewable tobacco products for at least 6 hours prior to                 surgery.  ____  5.  Bring all medications with you on the day of surgery if instructed.   ____  6.  Notify your doctor if there is any change in your medical condition      (cold, fever, infections).     Do not wear jewelry, make-up, hairpins, clips or nail polish. Do not wear lotions, powders, or perfumes. You may wear deodorant. Do not shave 48 hours prior to surgery. Men may shave face  and neck. Do not bring valuables to the hospital.    [facility] is not responsible for any belongings or valuables.  Contacts, dentures or bridgework may not be worn into surgery. Leave your suitcase in the car. After surgery it may be brought to your room. For patients admitted to the hospital, discharge time is determined by your treatment team.   Patients discharged the day of surgery will not be allowed to drive home.   Please read over the following fact sheets that you were given:  PREPARING FOR SURGERY   ____ Take these medicines the morning of surgery with A SIP OF WATER:    1. ZANTAC  2. FLOMAX  3.   4.  5.  6.  ____ Fleet Enema (as directed)   __X__ Use CHG Soap as directed  _X___ Stop ALL ASPIRIN PRODUCTS AS OF TODAY            THIS INCLUDES EXCEDRIN / BC POWDERS  __X__ Stop Anti-inflammatories AS OF TODAY              THIS INCLUDES IBUPROFEN / MOTRIN / ADVIL / ALEVE / MOBIC                     YOU MAY TAKE TYLENOL AT [facility]   ____ Stop supplements until after surgery.                   YOU MAY CONTINUE TO TAKE VITAMINS BUT DO NOT TAKE ON THE MORNING OF SURGERY  ____ Bring C-Pap to the hospital.   CONTINUE TO TAKE ALL NIGHT TIME MEDICINES AS USUAL.  BEGIN A STOOL SOFTENER PRIOR TO THE SURGERY. CONTINUE TO TAKE ONCE HOME IF TAKING NARCOTICS.  HAVE A PILLOW TO SUPPORT YOUR BELLY AFTER SURGERY  CONTINUE TO TAKE GABAPENTIN AS YOU USUALLY WOULD, BUT DO NOT TAKE ON THE MORNING OF SURGERY.  WEAR LOOSE FITTING PANTS ON THE DAY OF SURGERY.

## 2017-09-01 ENCOUNTER — Telehealth: Payer: Self-pay | Admitting: Cardiovascular Disease

## 2017-09-01 ENCOUNTER — Other Ambulatory Visit: Payer: Self-pay

## 2017-09-01 ENCOUNTER — Telehealth: Payer: Self-pay

## 2017-09-01 DIAGNOSIS — R9431 Abnormal electrocardiogram [ECG] [EKG]: Secondary | ICD-10-CM

## 2017-09-01 NOTE — Telephone Encounter (Signed)
New patient appointment on 09/02/17 with Dr. Fletcher Anon.

## 2017-09-01 NOTE — Care Management (Signed)
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 Will need medical or cardiology consult. No previous EKGs available.

## 2017-09-01 NOTE — Telephone Encounter (Signed)
° °  [facility] Pre-operative Risk Assessment    Request for surgical clearance:  What type of surgery is being performed? Repairing of the right Inguinal Hernia with Prosthetic mash 1. When is this surgery scheduled? 09/05/17  2. What type of clearance is required (medical clearance vs. Pharmacy clearance to hold med vs. Both)? Medical   3. Are there any medications that need to be held prior to surgery and how long? Not listed   4. Practice name and name of physician performing surgery? Dr Tollie Pizza   5. What is your office phone number [phone]   7.   What is your office fax number [phone]  8.   Anesthesia type (None, local, MAC, general) ? Not listed

## 2017-09-01 NOTE — Telephone Encounter (Signed)
Sherri with [facility] anesthesia called and said that the patient would need a cardiac clearance prior to surgery scheduled for 09/05/17 due to abnormal EKG. The patient is scheduled to see Dr Fletcher Anon at [facility] on 09/02/17 at 11:20 am. He is aware of time, date, and location.

## 2017-09-01 NOTE — Pre-Procedure Instructions (Signed)
SPOKE WITH Christopher Aguirre AT DR Dwyane Luo, THEY WILL SET PATIENT UP DIRECTLY WITH CARDIOLOGIST AND EKG AND REQUEST FAXED TO HIS OFFICE

## 2017-09-02 ENCOUNTER — Encounter: Payer: Self-pay | Admitting: Anesthesiology

## 2017-09-02 ENCOUNTER — Encounter: Payer: Self-pay | Admitting: Cardiovascular Disease

## 2017-09-02 ENCOUNTER — Ambulatory Visit (INDEPENDENT_AMBULATORY_CARE_PROVIDER_SITE_OTHER): Payer: Medicare Other | Admitting: Cardiovascular Disease

## 2017-09-02 VITALS — BP 128/84 | HR 73 | Ht 72.0 in | Wt 198.0 lb

## 2017-09-02 DIAGNOSIS — R0602 Shortness of breath: Secondary | ICD-10-CM

## 2017-09-02 DIAGNOSIS — I493 Ventricular premature depolarization: Secondary | ICD-10-CM

## 2017-09-02 DIAGNOSIS — Z01810 Encounter for preprocedural cardiovascular examination: Secondary | ICD-10-CM | POA: Diagnosis not present

## 2017-09-02 NOTE — Patient Instructions (Addendum)
Medication Instructions: Your physician recommends that you continue on your current medications as directed. Please refer to the Current Medication list given to you today.  If you need a refill on your cardiac medications before your next appointment, please call your pharmacy.   Procedures/Testing: Your physician has requested that you have an exercise stress myoview in one month. For further information please visit [email or web address]. Please follow instruction sheet, as given.  Your physician has recommended that you wear 24 hour a holter monitor after you have had the procedure on Monday. Holter monitors are medical devices that record the heart's electrical activity. Doctors most often use these monitors to diagnose arrhythmias. Arrhythmias are problems with the speed or rhythm of the heartbeat. The monitor is a small, portable device. You can wear one while you do your normal daily activities. This is usually used to diagnose what is causing palpitations/syncope (passing out).   Follow-Up: Your physician wants you to follow-up in 2 months with Dr. Fletcher Anon.   Thank you for choosing Heartcare at [facility]!     [place]  Your provider has ordered a Stress Test with nuclear imaging. The purpose of this test is to evaluate the blood supply to your heart muscle. This procedure is referred to as a "Non-Invasive Stress Test." This is because other than having an IV started in your vein, nothing is inserted or "invades" your body. Cardiac stress tests are done to find areas of poor blood flow to the heart by determining the extent of coronary artery disease (CAD). Some patients exercise on a treadmill, which naturally increases the blood flow to your heart, while others who are unable to walk on a treadmill due to physical limitations have a pharmacologic/chemical stress agent called Lexiscan . This medicine will mimic walking on a treadmill by temporarily increasing your coronary blood flow.    Please note: these test may take anywhere between 2-4 hours to complete  PLEASE REPORT TO [facility] AT THE FIRST DESK WILL DIRECT YOU WHERE TO GO  Date of Procedure:_____________________________________  Arrival Time for Procedure:______________________________  Instructions regarding medication:  None to hold   PLEASE NOTIFY THE OFFICE AT LEAST 24 HOURS IN ADVANCE IF YOU ARE UNABLE TO KEEP YOUR APPOINTMENT.  [phone] AND  PLEASE NOTIFY NUCLEAR MEDICINE AT [facility] AT LEAST 24 HOURS IN ADVANCE IF YOU ARE UNABLE TO KEEP YOUR APPOINTMENT. [phone]  How to prepare for your Myoview test:  1. Do not eat or drink after midnight 2. No caffeine for 24 hours prior to test 3. No smoking 24 hours prior to test. 4. Your medication may be taken with water.  If your doctor stopped a medication because of this test, do not take that medication. 5. Ladies, please do not wear dresses.  Skirts or pants are appropriate. Please wear a short sleeve shirt. 6. No perfume, cologne or lotion. 7. Wear comfortable walking shoes. No heels!

## 2017-09-02 NOTE — Progress Notes (Signed)
Cardiology Office Note   Date:  09/02/2017   ID:  Christopher Aguirre, DOB 11-Sep-1940, MRN [id number]  PCP:  Tracie Harrier, MD  Cardiologist:   Kathlyn Sacramento, MD   Chief Complaint  Patient presents with  . New Patient (Initial Visit)    Patient referred by Pre op for abnormal EKG. Patient denies chest pain and SOB. Meds reviewed verbally with patient.       History of Present Illness: Christopher Aguirre is a 77 y.o. male who was referred for preoperative cardiovascular evaluation before right inguinal hernia surgery.  The patient is not aware of any previous cardiac history although he remembers remotely being told about extra heartbeats.  He was evaluated with a stress test many years ago which was unremarkable according to his memory.  He has been relatively healthy throughout his life with no diabetes, hypertension hyperlipidemia.  He is a previous smoker and quit about 13 years ago.  There is family history of coronary artery disease but he cannot remember the details. He is physically very active and exercises regularly at the gym.  Usually he is able to walk on the treadmill at a speed of 3.5 mph for a long period of time without limitations.  He does complain of shortness of breath if he tries to jog.  No chest discomfort.  He had routine EKG done recently which showed sinus rhythm with bifascicular block and PVCs.  Thus he was referred.    Past Medical History:  Diagnosis Date  . Arthritis    shoulder, collar bone  . GERD (gastroesophageal reflux disease)   . Prostate enlargement   . RLS (restless legs syndrome)   . Wears dentures    partial upper    Past Surgical History:  Procedure Laterality Date  . COLONOSCOPY  2015?   Dr Vira Agar  . HERNIA REPAIR Left 12/01/2005   Dr Bary Castilla.  inguinal  . NASAL TURBINATE REDUCTION Bilateral 01/10/2015   Procedure: TURBINATE REDUCTION/SUBMUCOSAL ;  Surgeon: Beverly Gust, MD;  Location: [facility];  Service: ENT;   Laterality: Bilateral;  . SEPTOPLASTY N/A 01/10/2015   Procedure: SEPTOPLASTY;  Surgeon: Beverly Gust, MD;  Location: [facility];  Service: ENT;  Laterality: N/A;     Current Outpatient Medications  Medication Sig Dispense Refill  . acetaminophen (TYLENOL) 500 MG tablet Take 1,000 mg by mouth daily as needed for moderate pain or headache.    . Cholecalciferol (VITAMIN D) 2000 units tablet Take 2,000 Units by mouth every other day.    . Cyanocobalamin (B-12) 2500 MCG TABS Take 2,500 mcg by mouth every other day.    . diphenhydrAMINE (BENADRYL) 25 MG tablet Take 25 mg by mouth daily as needed for allergies.    . famotidine (PEPCID) 20 MG tablet Take 20 mg by mouth at bedtime.    . ferrous sulfate 325 (65 FE) MG tablet Take 325 mg by mouth daily.    . finasteride (PROSCAR) 5 MG tablet Take 5 mg by mouth at bedtime.   1  . gabapentin (NEURONTIN) 100 MG capsule Take 100 mg by mouth 2 (two) times daily.     . meloxicam (MOBIC) 7.5 MG tablet Take 7.5 mg by mouth daily.     . Multiple Vitamin (MULTIVITAMIN) capsule Take 1 capsule by mouth daily at 6 PM.     . ranitidine (ZANTAC) 150 MG capsule Take 150 mg by mouth 2 (two) times daily.    Marland Kitchen rOPINIRole (REQUIP) 0.5 MG tablet  Take 0.5 mg by mouth at bedtime as needed (for restless leg syndrome).    . tamsulosin (FLOMAX) 0.4 MG CAPS capsule Take 0.4 mg by mouth daily. In the morning after a meal    . zolpidem (AMBIEN) 5 MG tablet Take 2.5 mg by mouth at bedtime as needed for sleep.     No current facility-administered medications for this visit.     Allergies:   Hydrocodone and Pollen extract    Social History:  The patient  reports that he quit smoking about 13 years ago. His smoking use included cigarettes. He smoked 1.00 pack per day. He has never used smokeless tobacco. He reports that he drinks about 4.2 oz of alcohol per week. He reports that he does not use drugs.   Family History:  The patient's family history includes Aneurysm  in his father.    ROS:  Please see the history of present illness.   Otherwise, review of systems are positive for none.   All other systems are reviewed and negative.    PHYSICAL EXAM: VS:  BP 128/84 (BP Location: Right Arm, Patient Position: Sitting, Cuff Size: Normal)   Pulse 73   Ht 6' (1.829 m)   Wt 198 lb (89.8 kg)   BMI 26.85 kg/m  , BMI Body mass index is 26.85 kg/m. GEN: Well nourished, well developed, in no acute distress  HEENT: normal  Neck: no JVD, carotid bruits, or masses Cardiac: RRR; no murmurs, rubs, or gallops,no edema  Respiratory:  clear to auscultation bilaterally, normal work of breathing GI: soft, nontender, nondistended, + BS MS: no deformity or atrophy  Skin: warm and dry, no rash Neuro:  Strength and sensation are intact Psych: euthymic mood, full affect   EKG:  EKG is ordered today. The ekg ordered today demonstrates normal sinus rhythm with PVCs.  Right bundle branch block and left anterior fascicular block.  No evidence of prior infarct.   Recent Labs: 08/31/2017: BUN 28; Creatinine, Ser 1.04; Hemoglobin 15.4; Platelets 191; Potassium 4.1; Sodium 140    Lipid Panel No results found for: CHOL, TRIG, HDL, CHOLHDL, VLDL, LDLCALC, LDLDIRECT    Wt Readings from Last 3 Encounters:  09/02/17 198 lb (89.8 kg)  08/31/17 199 lb 8 oz (90.5 kg)  08/25/17 198 lb (89.8 kg)       PAD Screen 09/02/2017  Previous PAD dx? No  Previous surgical procedure? No  Pain with walking? No  Feet/toe relief with dangling? No  Painful, non-healing ulcers? No  Extremities discolored? No      ASSESSMENT AND PLAN:  1.  Preoperative cardiovascular evaluation for hernia surgery: The patient has no symptoms of angina or heart failure.  His functional capacity is very good and he exercises regularly at the gym with no limitations.  His EKG does not show evidence of prior infarcts or ischemia. Due to all of that, the patient can proceed with surgery next week at an  overall low risk from a cardiac standpoint.  2.  Asymptomatic PVCs: It appears that he had those in the past with negative remote stress test.  I do think we have to quantify the amount of PVCs and thus I am going to obtain a 24-hour Holter monitor which can be done after surgery. Given his symptoms of exertional dyspnea with jogging, I am going to obtain a treadmill nuclear stress test in about a month from now.  We can also evaluate his ejection fraction with that.     Disposition:  No need to delay the patient's surgery given minimal symptoms and good functional capacity.  Further cardiac work-up for PVCs and slightly abnormal EKG would be performed after surgery.  Signed,  Kathlyn Sacramento, MD  09/02/2017 11:21 AM    [place]

## 2017-09-04 MED ORDER — CEFAZOLIN SODIUM-DEXTROSE 2-4 GM/100ML-% IV SOLN: 2.0000 g | INTRAVENOUS | Status: DC

## 2017-09-05 ENCOUNTER — Ambulatory Visit
Admission: RE | Admit: 2017-09-05 | Discharge: 2017-09-05 | Disposition: A | Discharge status: Home / Self Care | Payer: Medicare Other | Source: Ambulatory Visit | Attending: General Surgery | Admitting: General Surgery

## 2017-09-05 ENCOUNTER — Other Ambulatory Visit: Payer: Self-pay

## 2017-09-05 ENCOUNTER — Ambulatory Visit: Payer: Medicare Other | Admitting: Anesthesiology

## 2017-09-05 ENCOUNTER — Encounter
Admission: RE | Disposition: A | Discharge status: Home / Self Care | Payer: Self-pay | Source: Ambulatory Visit | Attending: General Surgery

## 2017-09-05 DIAGNOSIS — M19019 Primary osteoarthritis, unspecified shoulder: Secondary | ICD-10-CM | POA: Insufficient documentation

## 2017-09-05 DIAGNOSIS — K219 Gastro-esophageal reflux disease without esophagitis: Secondary | ICD-10-CM | POA: Diagnosis not present

## 2017-09-05 DIAGNOSIS — G2581 Restless legs syndrome: Secondary | ICD-10-CM | POA: Insufficient documentation

## 2017-09-05 DIAGNOSIS — D171 Benign lipomatous neoplasm of skin and subcutaneous tissue of trunk: Secondary | ICD-10-CM | POA: Insufficient documentation

## 2017-09-05 DIAGNOSIS — Z87891 Personal history of nicotine dependence: Secondary | ICD-10-CM | POA: Insufficient documentation

## 2017-09-05 DIAGNOSIS — K4090 Unilateral inguinal hernia, without obstruction or gangrene, not specified as recurrent: Secondary | ICD-10-CM | POA: Insufficient documentation

## 2017-09-05 DIAGNOSIS — N400 Enlarged prostate without lower urinary tract symptoms: Secondary | ICD-10-CM | POA: Diagnosis not present

## 2017-09-05 DIAGNOSIS — Z8249 Family history of ischemic heart disease and other diseases of the circulatory system: Secondary | ICD-10-CM | POA: Insufficient documentation

## 2017-09-05 DIAGNOSIS — I4510 Unspecified right bundle-branch block: Secondary | ICD-10-CM | POA: Diagnosis not present

## 2017-09-05 DIAGNOSIS — Z79899 Other long term (current) drug therapy: Secondary | ICD-10-CM | POA: Diagnosis not present

## 2017-09-05 HISTORY — PX: LIPOMA EXCISION: SHX5283

## 2017-09-05 HISTORY — PX: INGUINAL HERNIA REPAIR: SHX194

## 2017-09-05 SURGERY — REPAIR, HERNIA, INGUINAL, ADULT
Anesthesia: General | Laterality: Right | Wound class: "Clean "

## 2017-09-05 MED ORDER — MIDAZOLAM HCL 2 MG/2ML IJ SOLN
INTRAMUSCULAR | Status: AC
  Filled 2017-09-05: qty 2

## 2017-09-05 MED ORDER — EPHEDRINE SULFATE 50 MG/ML IJ SOLN
INTRAMUSCULAR | Status: DC | PRN
  Administered 2017-09-05 (×2): 10 mg via INTRAVENOUS

## 2017-09-05 MED ORDER — BUPIVACAINE-EPINEPHRINE (PF) 0.5% -1:200000 IJ SOLN
INTRAMUSCULAR | Status: AC
  Filled 2017-09-05: qty 30

## 2017-09-05 MED ORDER — TRAMADOL HCL 50 MG PO TABS: 50.0000 mg | ORAL_TABLET | Freq: Four times a day (QID) | ORAL | 0 refills | Status: DC | PRN

## 2017-09-05 MED ORDER — LIDOCAINE HCL (CARDIAC) PF 100 MG/5ML IV SOSY
PREFILLED_SYRINGE | INTRAVENOUS | Status: DC | PRN
  Administered 2017-09-05: 40 mg via INTRAVENOUS

## 2017-09-05 MED ORDER — CELECOXIB 200 MG PO CAPS
200.0000 mg | ORAL_CAPSULE | ORAL | Status: AC
  Administered 2017-09-05: 200 mg via ORAL

## 2017-09-05 MED ORDER — BUPIVACAINE-EPINEPHRINE (PF) 0.5% -1:200000 IJ SOLN
INTRAMUSCULAR | Status: DC | PRN
  Administered 2017-09-05: 10 mL via PERINEURAL
  Administered 2017-09-05: 20 mL via PERINEURAL

## 2017-09-05 MED ORDER — FENTANYL CITRATE (PF) 100 MCG/2ML IJ SOLN: 25.0000 ug | INTRAMUSCULAR | Status: DC | PRN

## 2017-09-05 MED ORDER — CEFAZOLIN SODIUM-DEXTROSE 2-4 GM/100ML-% IV SOLN
INTRAVENOUS | Status: AC
  Filled 2017-09-05: qty 100

## 2017-09-05 MED ORDER — ACETAMINOPHEN 10 MG/ML IV SOLN
INTRAVENOUS | Status: AC
  Filled 2017-09-05: qty 100

## 2017-09-05 MED ORDER — FENTANYL CITRATE (PF) 100 MCG/2ML IJ SOLN
INTRAMUSCULAR | Status: DC | PRN
  Administered 2017-09-05: 50 ug via INTRAVENOUS
  Administered 2017-09-05: 25 ug via INTRAVENOUS

## 2017-09-05 MED ORDER — GLYCOPYRROLATE 0.2 MG/ML IJ SOLN
INTRAMUSCULAR | Status: DC | PRN
  Administered 2017-09-05: .2 mg via INTRAVENOUS

## 2017-09-05 MED ORDER — DEXAMETHASONE SODIUM PHOSPHATE 10 MG/ML IJ SOLN
INTRAMUSCULAR | Status: DC | PRN
  Administered 2017-09-05: 10 mg via INTRAVENOUS

## 2017-09-05 MED ORDER — CELECOXIB 200 MG PO CAPS
ORAL_CAPSULE | ORAL | Status: AC
  Administered 2017-09-05: 200 mg via ORAL
  Filled 2017-09-05: qty 1

## 2017-09-05 MED ORDER — MIDAZOLAM HCL 2 MG/2ML IJ SOLN
INTRAMUSCULAR | Status: DC | PRN
  Administered 2017-09-05: 2 mg via INTRAVENOUS

## 2017-09-05 MED ORDER — GLYCOPYRROLATE 0.2 MG/ML IJ SOLN
INTRAMUSCULAR | Status: AC
  Filled 2017-09-05: qty 1

## 2017-09-05 MED ORDER — TRAMADOL HCL 50 MG PO TABS
ORAL_TABLET | ORAL | Status: AC
  Administered 2017-09-05: 50 mg via ORAL
  Filled 2017-09-05: qty 1

## 2017-09-05 MED ORDER — TRAMADOL HCL 50 MG PO TABS
50.0000 mg | ORAL_TABLET | Freq: Once | ORAL | Status: AC
  Administered 2017-09-05: 50 mg via ORAL

## 2017-09-05 MED ORDER — PROPOFOL 10 MG/ML IV BOLUS
INTRAVENOUS | Status: DC | PRN
  Administered 2017-09-05: 140 mg via INTRAVENOUS

## 2017-09-05 MED ORDER — GABAPENTIN 300 MG PO CAPS
ORAL_CAPSULE | ORAL | Status: AC
  Administered 2017-09-05: 300 mg via ORAL
  Filled 2017-09-05: qty 1

## 2017-09-05 MED ORDER — GABAPENTIN 300 MG PO CAPS
300.0000 mg | ORAL_CAPSULE | ORAL | Status: AC
  Administered 2017-09-05: 300 mg via ORAL

## 2017-09-05 MED ORDER — DEXTROSE-NACL 5-0.2 % IV SOLN: INTRAVENOUS | Status: DC | PRN

## 2017-09-05 MED ORDER — CELECOXIB 200 MG PO CAPS
ORAL_CAPSULE | ORAL | Status: AC
  Filled 2017-09-05: qty 1

## 2017-09-05 MED ORDER — LACTATED RINGERS IV SOLN
INTRAVENOUS | Status: DC
  Administered 2017-09-05: 08:00:00 via INTRAVENOUS
  Administered 2017-09-05: 1000 mL via INTRAVENOUS

## 2017-09-05 MED ORDER — SEVOFLURANE IN SOLN
RESPIRATORY_TRACT | Status: AC
  Filled 2017-09-05: qty 250

## 2017-09-05 MED ORDER — ONDANSETRON HCL 4 MG/2ML IJ SOLN: 4.0000 mg | Freq: Once | INTRAMUSCULAR | Status: DC | PRN

## 2017-09-05 MED ORDER — FENTANYL CITRATE (PF) 100 MCG/2ML IJ SOLN
INTRAMUSCULAR | Status: AC
  Filled 2017-09-05: qty 2

## 2017-09-05 MED ORDER — BUPIVACAINE HCL (PF) 0.5 % IJ SOLN
INTRAMUSCULAR | Status: AC
  Filled 2017-09-05: qty 30

## 2017-09-05 SURGICAL SUPPLY — 46 items
BLADE SURG 15 STRL SS SAFETY (BLADE) ×6 IMPLANT
CANISTER SUCT 1200ML W/VALVE (MISCELLANEOUS) ×4 IMPLANT
CHLORAPREP W/TINT 26ML (MISCELLANEOUS) ×4 IMPLANT
CLOSURE WOUND 1/2 X4 (GAUZE/BANDAGES/DRESSINGS) ×1
DECANTER SPIKE VIAL GLASS SM (MISCELLANEOUS) ×4 IMPLANT
DRAIN PENROSE 1/4X12 LTX (DRAIN) ×4 IMPLANT
DRAPE LAPAROTOMY 100X77 ABD (DRAPES) ×4 IMPLANT
DRSG TEGADERM 4X4.75 (GAUZE/BANDAGES/DRESSINGS) ×6 IMPLANT
DRSG TELFA 4X3 1S NADH ST (GAUZE/BANDAGES/DRESSINGS) ×6 IMPLANT
ELECT CAUTERY BLADE 6.4 (BLADE) ×2 IMPLANT
ELECT REM PT RETURN 9FT ADLT (ELECTROSURGICAL) ×4
ELECTRODE REM PT RTRN 9FT ADLT (ELECTROSURGICAL) ×2 IMPLANT
GLOVE BIO SURGEON STRL SZ7.5 (GLOVE) ×6 IMPLANT
GLOVE INDICATOR 8.0 STRL GRN (GLOVE) ×6 IMPLANT
GOWN STRL REUS W/ TWL LRG LVL3 (GOWN DISPOSABLE) ×4 IMPLANT
GOWN STRL REUS W/TWL LRG LVL3 (GOWN DISPOSABLE) ×4
KIT TURNOVER KIT A (KITS) ×4 IMPLANT
LABEL OR SOLS (LABEL) ×2 IMPLANT
MESH HERNIA 6X12 ULTRAPRO MED (Mesh General) ×2 IMPLANT
MESH HERNIA ULTRAPRO MED (Mesh General) ×2 IMPLANT
NDL HYPO 25X1 1.5 SAFETY (NEEDLE) ×2 IMPLANT
NEEDLE HYPO 22GX1.5 SAFETY (NEEDLE) ×8 IMPLANT
NEEDLE HYPO 25X1 1.5 SAFETY (NEEDLE) ×4 IMPLANT
NS IRRIG 500ML POUR BTL (IV SOLUTION) ×4 IMPLANT
PACK BASIN MINOR ARMC (MISCELLANEOUS) ×4 IMPLANT
SPONGE LAP 18X18 RF (DISPOSABLE) ×4 IMPLANT
STAPLER SKIN PROX 35W (STAPLE) ×2 IMPLANT
STRIP CLOSURE SKIN 1/2X4 (GAUZE/BANDAGES/DRESSINGS) ×3 IMPLANT
SUT PDS AB 0 CT1 27 (SUTURE) ×2 IMPLANT
SUT SURGILON 0 BLK (SUTURE) ×8 IMPLANT
SUT VIC AB 2-0 BRD 54 (SUTURE) ×4 IMPLANT
SUT VIC AB 2-0 CT1 27 (SUTURE) ×2
SUT VIC AB 2-0 CT1 36 (SUTURE) ×2 IMPLANT
SUT VIC AB 2-0 CT1 TAPERPNT 27 (SUTURE) ×2 IMPLANT
SUT VIC AB 2-0 SH 27 (SUTURE)
SUT VIC AB 2-0 SH 27XBRD (SUTURE) ×2 IMPLANT
SUT VIC AB 3-0 54X BRD REEL (SUTURE) ×2 IMPLANT
SUT VIC AB 3-0 BRD 54 (SUTURE)
SUT VIC AB 3-0 SH 27 (SUTURE) ×4
SUT VIC AB 3-0 SH 27X BRD (SUTURE) ×2 IMPLANT
SUT VIC AB 4-0 FS2 27 (SUTURE) ×6 IMPLANT
SUT VICRYL+ 3-0 144IN (SUTURE) ×4 IMPLANT
SWABSTK COMLB BENZOIN TINCTURE (MISCELLANEOUS) ×4 IMPLANT
SYR 10ML LL (SYRINGE) ×4 IMPLANT
SYR 3ML LL SCALE MARK (SYRINGE) ×4 IMPLANT
SYR CONTROL 10ML (SYRINGE) ×2 IMPLANT

## 2017-09-05 NOTE — OR Nursing (Signed)
Discharge instructions discussed with pt and wife. Both voice understanding. 

## 2017-09-05 NOTE — H&P (Signed)
No change in clinical history or exam. For right inguinal hernia repair and epigastric hernia repair.

## 2017-09-05 NOTE — Discharge Instructions (Signed)

## 2017-09-05 NOTE — Anesthesia Preprocedure Evaluation (Signed)
Anesthesia Evaluation  Patient identified by MRN, date of birth, ID band Patient awake    Reviewed: Allergy & Precautions, NPO status , Patient's Chart, lab work & pertinent test results, reviewed documented beta blocker date and time   Airway Mallampati: II  TM Distance: >3 FB     Dental  (+) Chipped, Partial Upper   Pulmonary former smoker,           Cardiovascular      Neuro/Psych    GI/Hepatic GERD  Controlled,  Endo/Other    Renal/GU      Musculoskeletal  (+) Arthritis ,   Abdominal   Peds  Hematology   Anesthesia Other Findings PVCs in the past. Rbbb. [facility]. Dr. Fletcher Anon oks.  Reproductive/Obstetrics                             Anesthesia Physical Anesthesia Plan  ASA: III  Anesthesia Plan: General   Post-op Pain Management:    Induction: Intravenous  PONV Risk Score and Plan:   Airway Management Planned: LMA  Additional Equipment:   Intra-op Plan:   Post-operative Plan:   Informed Consent: I have reviewed the patients History and Physical, chart, labs and discussed the procedure including the risks, benefits and alternatives for the proposed anesthesia with the patient or authorized representative who has indicated his/her understanding and acceptance.     Plan Discussed with: CRNA  Anesthesia Plan Comments:         Anesthesia Quick Evaluation

## 2017-09-05 NOTE — Op Note (Signed)
Preoperative diagnosis: Right inguinal hernia, epigastric hernia versus lipoma.  Postoperative diagnosis: Right indirect inguinal hernia, epigastric lipoma.  Operative procedure: Repair of right indirect inguinal hernia with medium Ultra Pro mesh.  Excision epigastric lipoma.  Operating Surgeon: Hervey Ard, MD.  Anesthesia: General by LMA, Marcaine 0.5% with 1 to 200,000 units of epinephrine, 30 cc; Toradol: 30 mg.  Estimated blood loss: Less than 5 cc.  Clinical note: This 77 year old male developed a symptomatic right inguinal hernia and also noticed a soft tissue mass in the epigastrium.  The latter may well represent a lipoma but with the possibility of an epigastric hernia it was elected to approach this at the time of his inguinal hernia repair.  The patient received Kefzol prior to the procedure.  Hair had been removed from the surgical site with clippers prior to presentation of the operating theater.  SCD stockings for DVT prevention.  Operative note: With the patient under adequate general anesthesia the area was cleansed with ChloraPrep and draped.  Field block anesthesia was placed in the right lower quadrant for postoperative analgesia.  A 5 cm skin line incision was made and carried out the skin and subcutaneous tissue overlying the anticipated course the inguinal canal.  Hemostasis was electrocautery.  The external Bleich was opened in direction of its fibers.  The ilioinguinal and iliohypogastric nerves were identified and protected.  A moderately sized, densely scarred indirect sac was dissected free from the adjacent cord structures.  The preperitoneal space was cleared.  A medium ultra Pro mesh was smooth into position.  The external component was anchored to the pubic tubercle and along the inguinal ligament with interrupted 0 Surgilon sutures.  The medial and superior borders were anchored to the transverse abdominis aponeurosis.  A lateral slit was made for cord passage.   Toradol was placed in the wound.  The external Bleich was closed with a running 2-0 Vicryl suture.  Scarpa's fascia was closed with a running 3-0 Vicryl suture.  The skin closed with a running 4-0 Vicryl subcuticular suture.  The area of the epigastric hernia had previously been infiltrated with local anesthetic.  A transverse incision was made over the mass and skin incised sharply and remaining dissection completed with electrocautery.  A multilobulated lipoma was asked identified.  This was approximately 3 cm in diameter.  No fascial defect was noted.  The soft tissue was approximated with 2-0 Vicryl figure-of-eight sutures.  The skin closed with running 4-0 Vicryl subcuticular suture.  Benzoin, Steri-Strips, Telfa and Tegaderm dressing applied.  The patient tolerated the procedure well and was taken to recovery in stable condition.

## 2017-09-05 NOTE — Anesthesia Postprocedure Evaluation (Signed)
Anesthesia Post Note  Patient: Christopher Aguirre  Procedure(s) Performed: HERNIA REPAIR INGUINAL ADULT (Right ) EXCISION LIPOMA  Patient location during evaluation: PACU Anesthesia Type: General Level of consciousness: awake and alert Pain management: pain level controlled Vital Signs Assessment: post-procedure vital signs reviewed and stable Respiratory status: spontaneous breathing, nonlabored ventilation, respiratory function stable and patient connected to nasal cannula oxygen Cardiovascular status: blood pressure returned to baseline and stable Postop Assessment: no apparent nausea or vomiting Anesthetic complications: no     Last Vitals:  Vitals:   09/05/17 0918 09/05/17 0933  BP: 123/75 112/88  Pulse: 79 76  Resp: 14   Temp: (!) 36.4 C   SpO2: 96% 96%    Last Pain:  Vitals:   09/05/17 0932  TempSrc:   PainSc: 3                  Sheffield Hawker S

## 2017-09-05 NOTE — Anesthesia Procedure Notes (Signed)
Procedure Name: LMA Insertion Date/Time: 09/05/2017 7:40 AM Performed by: Allean Found, CRNA Pre-anesthesia Checklist: Patient identified, Emergency Drugs available, Suction available, Patient being monitored and Timeout performed Patient Re-evaluated:Patient Re-evaluated prior to induction Oxygen Delivery Method: Circle system utilized Preoxygenation: Pre-oxygenation with 100% oxygen Induction Type: IV induction Ventilation: Mask ventilation without difficulty LMA: LMA inserted LMA Size: 5.0 Tube type: Oral Number of attempts: 1 Placement Confirmation: positive ETCO2 and breath sounds checked- equal and bilateral Tube secured with: Tape Dental Injury: Teeth and Oropharynx as per pre-operative assessment

## 2017-09-05 NOTE — Anesthesia Post-op Follow-up Note (Signed)
Anesthesia QCDR form completed.        

## 2017-09-05 NOTE — Transfer of Care (Signed)
Immediate Anesthesia Transfer of Care Note  Patient: Christopher Aguirre  Procedure(s) Performed: HERNIA REPAIR INGUINAL ADULT (Right ) EXCISION LIPOMA  Patient Location: PACU  Anesthesia Type:General  Level of Consciousness: sedated  Airway & Oxygen Therapy: Patient Spontanous Breathing and Patient connected to face mask oxygen  Post-op Assessment: Report given to RN and Post -op Vital signs reviewed and stable  Post vital signs: Reviewed and stable  Last Vitals:  Vitals Value Taken Time  BP 116/79 09/05/2017  8:44 AM  Temp    Pulse 104 09/05/2017  8:47 AM  Resp 25 09/05/2017  8:47 AM  SpO2 96 % 09/05/2017  8:47 AM  Vitals shown include unvalidated device data.  Last Pain:  Vitals:   09/05/17 0616  TempSrc: Oral  PainSc: 0-No pain         Complications: No apparent anesthesia complications

## 2017-09-08 ENCOUNTER — Ambulatory Visit: Payer: Self-pay | Admitting: General Surgery

## 2017-09-09 ENCOUNTER — Telehealth: Payer: Self-pay | Admitting: *Deleted

## 2017-09-09 NOTE — Telephone Encounter (Signed)
Patient called and stated that he had surgery on 09/05/17 and wants to know when he could take his bandages off.

## 2017-09-09 NOTE — Telephone Encounter (Signed)
Patient instructed he may remove waterproof dressing and Telfa, but to leave the steri strips in place. He may shower, just pat the area dry and no need to apply anything to the area. Follow up as scheduled.

## 2017-09-15 ENCOUNTER — Ambulatory Visit (INDEPENDENT_AMBULATORY_CARE_PROVIDER_SITE_OTHER): Payer: Medicare Other | Admitting: General Surgery

## 2017-09-15 ENCOUNTER — Encounter: Payer: Self-pay | Admitting: General Surgery

## 2017-09-15 VITALS — BP 130/70 | HR 68 | Resp 13 | Ht 72.0 in | Wt 200.0 lb

## 2017-09-15 DIAGNOSIS — K4090 Unilateral inguinal hernia, without obstruction or gangrene, not specified as recurrent: Secondary | ICD-10-CM

## 2017-09-15 NOTE — Progress Notes (Signed)
Patient ID: Christopher Aguirre, male   DOB: [birth year], 77 y.o.   MRN: [id number]  Chief Complaint  Patient presents with  . Routine Post Op    HPI Christopher Aguirre is a 77 y.o. male here today for his post op right inguinal hernia repair done on 09/05/2017. Patient states he is doing okay, having some pain under incision site.  HPI  Past Medical History:  Diagnosis Date  . Arthritis    shoulder, collar bone  . GERD (gastroesophageal reflux disease)   . Prostate enlargement   . RLS (restless legs syndrome)   . Wears dentures    partial upper    Past Surgical History:  Procedure Laterality Date  . COLONOSCOPY  2015?   Dr Vira Agar  . HERNIA REPAIR Left 12/01/2005   Dr Bary Castilla.  inguinal  . INGUINAL HERNIA REPAIR Right 09/05/2017   Procedure: HERNIA REPAIR INGUINAL ADULT;  Surgeon: Robert Bellow, MD;  Location: [facility];  Service: General;  Laterality: Right;  . LIPOMA EXCISION  09/05/2017   Procedure: EXCISION LIPOMA;  Surgeon: Robert Bellow, MD;  Location: [facility];  Service: General;;  . NASAL TURBINATE REDUCTION Bilateral 01/10/2015   Procedure: Albin Felling REDUCTION/SUBMUCOSAL ;  Surgeon: Beverly Gust, MD;  Location: [place];  Service: ENT;  Laterality: Bilateral;  . SEPTOPLASTY N/A 01/10/2015   Procedure: SEPTOPLASTY;  Surgeon: Beverly Gust, MD;  Location: [place];  Service: ENT;  Laterality: N/A;    Family History  Problem Relation Age of Onset  . Aneurysm Father     Social History Social History   Tobacco Use  . Smoking status: Former Smoker    Packs/day: 1.00    Types: Cigarettes    Last attempt to quit: 02/09/2004    Years since quitting: 13.6  . Smokeless tobacco: Never Used  . Tobacco comment: quit 2006  Substance Use Topics  . Alcohol use: Yes    Alcohol/week: 7.0 standard drinks    Types: 7 Cans of beer per week  . Drug use: Never    Allergies  Allergen Reactions  . Hydrocodone Other (See Comments)    Makes patient feel  terrible and too woozie  . Pollen Extract     Respiratory symptoms    Current Outpatient Medications  Medication Sig Dispense Refill  . acetaminophen (TYLENOL) 500 MG tablet Take 1,000 mg by mouth daily as needed for moderate pain or headache.    . Cholecalciferol (VITAMIN D) 2000 units tablet Take 2,000 Units by mouth every other day.    . Cyanocobalamin (B-12) 2500 MCG TABS Take 2,500 mcg by mouth every other day.    . diphenhydrAMINE (BENADRYL) 25 MG tablet Take 25 mg by mouth daily as needed for allergies.    . famotidine (PEPCID) 20 MG tablet Take 20 mg by mouth at bedtime.    . ferrous sulfate 325 (65 FE) MG tablet Take 325 mg by mouth daily.    . finasteride (PROSCAR) 5 MG tablet Take 5 mg by mouth at bedtime.   1  . gabapentin (NEURONTIN) 100 MG capsule Take 100 mg by mouth 2 (two) times daily.     . meloxicam (MOBIC) 7.5 MG tablet Take 7.5 mg by mouth daily.     . Multiple Vitamin (MULTIVITAMIN) capsule Take 1 capsule by mouth daily at 6 PM.     . ranitidine (ZANTAC) 150 MG capsule Take 150 mg by mouth 2 (two) times daily.    Marland Kitchen rOPINIRole (REQUIP) 0.5 MG tablet  Take 0.5 mg by mouth at bedtime as needed (for restless leg syndrome).    . tamsulosin (FLOMAX) 0.4 MG CAPS capsule Take 0.4 mg by mouth daily. In the morning after a meal    . traMADol (ULTRAM) 50 MG tablet Take 1 tablet (50 mg total) by mouth every 6 (six) hours as needed. 20 tablet 0  . zolpidem (AMBIEN) 5 MG tablet Take 2.5 mg by mouth at bedtime as needed for sleep.     No current facility-administered medications for this visit.     Review of Systems Review of Systems  Constitutional: Negative.   Respiratory: Negative.   Cardiovascular: Negative.     Blood pressure 130/70, pulse 68, resp. rate 13, height 6' (1.829 m), weight 200 lb (90.7 kg).  Physical Exam Physical Exam  Constitutional: He is oriented to person, place, and time. He appears well-developed and well-nourished.  Abdominal:  Right inguinal  incision site is clean and healing well.   Genitourinary:     Neurological: He is alert and oriented to person, place, and time.  Skin: Skin is warm and dry.       Assessment    Doing well post inguinal hernia repair.    Plan  Proper lifting techniques reviewed. Return in one month. .The patient is aware to call back for any questions or concerns.    HPI, Physical Exam, Assessment and Plan have been scribed under the direction and in the presence of Hervey Ard, MD.  Gaspar Cola, CMA  I have completed the exam and reviewed the above documentation for accuracy and completeness.  I agree with the above.  Haematologist has been used and any errors in dictation or transcription are unintentional.  Hervey Ard, M.D., F.A.C.S.   Christopher Aguirre 09/16/2017, 3:11 PM

## 2017-09-15 NOTE — Patient Instructions (Signed)
  Proper lifting techniques reviewed. Return in one month. .The patient is aware to call back for any questions or concerns.

## 2017-09-16 ENCOUNTER — Ambulatory Visit (INDEPENDENT_AMBULATORY_CARE_PROVIDER_SITE_OTHER): Payer: Medicare Other

## 2017-09-16 ENCOUNTER — Encounter: Payer: Self-pay | Admitting: General Surgery

## 2017-09-16 DIAGNOSIS — I493 Ventricular premature depolarization: Secondary | ICD-10-CM

## 2017-09-27 ENCOUNTER — Ambulatory Visit
Admission: RE | Admit: 2017-09-27 | Discharge: 2017-09-27 | Disposition: A | Discharge status: Home / Self Care | Payer: Medicare Other | Source: Ambulatory Visit | Attending: Cardiovascular Disease | Admitting: Cardiovascular Disease

## 2017-09-27 DIAGNOSIS — I493 Ventricular premature depolarization: Secondary | ICD-10-CM | POA: Diagnosis present

## 2017-10-04 ENCOUNTER — Other Ambulatory Visit: Payer: Medicare Other

## 2017-10-17 ENCOUNTER — Telehealth: Payer: Self-pay | Admitting: Cardiovascular Disease

## 2017-10-17 NOTE — Telephone Encounter (Signed)
Patient returning call for monitor results Please call to discuss

## 2017-10-17 NOTE — Telephone Encounter (Signed)
Patient aware of results.

## 2017-10-18 ENCOUNTER — Ambulatory Visit (INDEPENDENT_AMBULATORY_CARE_PROVIDER_SITE_OTHER): Payer: Medicare Other | Admitting: General Surgery

## 2017-10-18 ENCOUNTER — Encounter: Payer: Self-pay | Admitting: General Surgery

## 2017-10-18 VITALS — BP 124/68 | HR 79 | Resp 12 | Ht 72.0 in | Wt 203.0 lb

## 2017-10-18 DIAGNOSIS — K4090 Unilateral inguinal hernia, without obstruction or gangrene, not specified as recurrent: Secondary | ICD-10-CM

## 2017-10-18 NOTE — Patient Instructions (Addendum)
Patient to return as needed. Patient to be scheduled for excision lipoma from forearm in office. Proper lifting techniques reviewed. The patient is aware to call back for any questions or concerns.

## 2017-10-18 NOTE — Progress Notes (Signed)
Patient ID: Christopher Aguirre, male   DOB: 07-13-1940, 77 y.o.   MRN: [id number]  Chief Complaint  Patient presents with  . Routine Post Op    HPI Christopher Aguirre is a 77 y.o. male here today for his post op right inguinal hernia repair done on 09/05/2017. Patient states he is doing well.   Past Medical History:  Diagnosis Date  . Arthritis    shoulder, collar bone  . GERD (gastroesophageal reflux disease)   . Prostate enlargement   . RLS (restless legs syndrome)   . Wears dentures    partial upper    Past Surgical History:  Procedure Laterality Date  . COLONOSCOPY  2015?   Dr Vira Agar  . HERNIA REPAIR Left 12/01/2005   Dr Bary Castilla.  inguinal  . INGUINAL HERNIA REPAIR Right 09/05/2017   Medium Ultra Pro mesh Surgeon: Robert Bellow, MD;  Location: [facility];  Service: General;  Laterality: Right;  . LIPOMA EXCISION  09/05/2017   Procedure: EXCISION LIPOMA;  Surgeon: Robert Bellow, MD;  Location: [facility];  Service: General;;  . NASAL TURBINATE REDUCTION Bilateral 01/10/2015   Procedure: Albin Felling REDUCTION/SUBMUCOSAL ;  Surgeon: Beverly Gust, MD;  Location: [place];  Service: ENT;  Laterality: Bilateral;  . SEPTOPLASTY N/A 01/10/2015   Procedure: SEPTOPLASTY;  Surgeon: Beverly Gust, MD;  Location: [place];  Service: ENT;  Laterality: N/A;    Family History  Problem Relation Age of Onset  . Aneurysm Father     Social History Social History   Tobacco Use  . Smoking status: Former Smoker    Packs/day: 1.00    Types: Cigarettes    Last attempt to quit: 02/09/2004    Years since quitting: 13.7  . Smokeless tobacco: Never Used  . Tobacco comment: quit 2006  Substance Use Topics  . Alcohol use: Yes    Alcohol/week: 7.0 standard drinks    Types: 7 Cans of beer per week  . Drug use: Never    Allergies  Allergen Reactions  . Hydrocodone Other (See Comments)    Makes patient feel terrible and too woozie  . Pollen Extract     Respiratory  symptoms    Current Outpatient Medications  Medication Sig Dispense Refill  . acetaminophen (TYLENOL) 500 MG tablet Take 1,000 mg by mouth daily as needed for moderate pain or headache.    . Cholecalciferol (VITAMIN D) 2000 units tablet Take 2,000 Units by mouth every other day.    . Cyanocobalamin (B-12) 2500 MCG TABS Take 2,500 mcg by mouth every other day.    . diphenhydrAMINE (BENADRYL) 25 MG tablet Take 25 mg by mouth daily as needed for allergies.    . famotidine (PEPCID) 20 MG tablet Take 20 mg by mouth at bedtime.    . ferrous sulfate 325 (65 FE) MG tablet Take 325 mg by mouth daily.    . finasteride (PROSCAR) 5 MG tablet Take 5 mg by mouth at bedtime.   1  . gabapentin (NEURONTIN) 100 MG capsule Take 100 mg by mouth 2 (two) times daily.     . meloxicam (MOBIC) 7.5 MG tablet Take 7.5 mg by mouth daily.     . Multiple Vitamin (MULTIVITAMIN) capsule Take 1 capsule by mouth daily at 6 PM.     . ranitidine (ZANTAC) 150 MG capsule Take 150 mg by mouth 2 (two) times daily.    Marland Kitchen rOPINIRole (REQUIP) 0.5 MG tablet Take 0.5 mg by mouth at bedtime as needed (  for restless leg syndrome).    . tamsulosin (FLOMAX) 0.4 MG CAPS capsule Take 0.4 mg by mouth daily. In the morning after a meal    . zolpidem (AMBIEN) 5 MG tablet Take 2.5 mg by mouth at bedtime as needed for sleep.     No current facility-administered medications for this visit.     Review of Systems Review of Systems  Blood pressure 124/68, pulse 79, resp. rate 12, height 6' (1.829 m), weight 203 lb (92.1 kg).  Physical Exam Physical Exam  Constitutional: He is oriented to person, place, and time. He appears well-developed and well-nourished.  Abdominal:    Right inguinal hernia repair is intact and healing well.   Neurological: He is alert and oriented to person, place, and time.  Skin: Skin is warm and dry.       Assessment    Doing well post repair of right inguinal hernia and excision of epigastric lipoma..     Plan  Patient to return as needed. Patient to be scheduled for excision lipoma from left forearm in office.  Proper lifting techniques reviewed.The patient is aware to call back for any questions or concerns.   HPI, Physical Exam, Assessment and Plan have been scribed under the direction and in the presence of Hervey Ard, MD.  Gaspar Cola, CMA  I have completed the exam and reviewed the above documentation for accuracy and completeness.  I agree with the above.  Haematologist has been used and any errors in dictation or transcription are unintentional.  Hervey Ard, M.D., F.A.C.S.  Forest Gleason Dazja Houchin 10/20/2017, 8:30 AM

## 2017-11-04 ENCOUNTER — Ambulatory Visit
Admission: RE | Admit: 2017-11-04 | Discharge: 2017-11-04 | Disposition: A | Discharge status: Home / Self Care | Payer: Medicare Other | Source: Ambulatory Visit | Attending: Cardiovascular Disease | Admitting: Cardiovascular Disease

## 2017-11-04 DIAGNOSIS — R0602 Shortness of breath: Secondary | ICD-10-CM

## 2017-11-04 LAB — NM MYOCAR MULTI W/SPECT W/WALL MOTION / EF
CHL CUP NUCLEAR SDS: 0
CHL CUP NUCLEAR SRS: 6
CHL CUP NUCLEAR SSS: 0
CHL CUP RESTING HR STRESS: 65 {beats}/min
CSEPED: 7 min
CSEPEW: 7 METS
CSEPPHR: 148 {beats}/min
Exercise duration (sec): 1 s
LV dias vol: 72 mL (ref 62–150)
LV sys vol: 22 mL
Percent HR: 102 %
TID: 0.75

## 2017-11-04 MED ORDER — TECHNETIUM TC 99M TETROFOSMIN IV KIT
30.0000 | PACK | Freq: Once | INTRAVENOUS | Status: AC | PRN
  Administered 2017-11-04: 30.16 via INTRAVENOUS

## 2017-11-04 MED ORDER — TECHNETIUM TC 99M TETROFOSMIN IV KIT
11.1910 | PACK | Freq: Once | INTRAVENOUS | Status: AC | PRN
  Administered 2017-11-04: 11.191 via INTRAVENOUS

## 2017-11-08 ENCOUNTER — Encounter: Payer: Self-pay | Admitting: Cardiovascular Disease

## 2017-11-08 ENCOUNTER — Ambulatory Visit (INDEPENDENT_AMBULATORY_CARE_PROVIDER_SITE_OTHER): Payer: Medicare Other | Admitting: Cardiovascular Disease

## 2017-11-08 VITALS — BP 126/80 | HR 78 | Ht 72.0 in | Wt 203.0 lb

## 2017-11-08 DIAGNOSIS — I493 Ventricular premature depolarization: Secondary | ICD-10-CM | POA: Diagnosis not present

## 2017-11-08 NOTE — Progress Notes (Signed)
Cardiology Office Note   Date:  11/08/2017   ID:  Christopher Aguirre, DOB 12/19/1940, MRN [id number]  PCP:  Tracie Harrier, MD  Cardiologist:   Kathlyn Sacramento, MD   Chief Complaint  Patient presents with  . other    2 mo f/u myoview 11/04/17 Holter 09/16/17. Medications reviewed verbally.       History of Present Illness: Christopher Aguirre is a 77 y.o. male who is here today for follow-up visit regarding asymptomatic PVCs.  He was seen recently for preoperative cardiovascular evaluation before hernia surgery.  He was noted to have frequent PVCs on his EKG.     He has been relatively healthy throughout his life with no diabetes, hypertension hyperlipidemia.  He is a previous smoker and quit about 13 years ago.  There is family history of coronary artery disease but he cannot remember the details. He is physically very active and exercises regularly at the gym.  Usually he is able to walk on the treadmill at a speed of 3.5 mph for a long period of time without limitations.  He does complain of shortness of breath if he tries to jog.  No chest discomfort. 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  The patient was cleared to have the surgery done given lack of anginal symptoms and good functional capacity.  He had the surgery done without complications.  He had a Holter monitor done which showed 1500 PVCs in 24 hours.  He underwent a treadmill Myoview which showed no evidence of ischemia with normal ejection fraction. He continues to feel well with no chest pain, shortness of breath or palpitations.    Past Medical History:  Diagnosis Date  . Arthritis    shoulder, collar bone  . GERD (gastroesophageal reflux disease)   . Prostate enlargement   . RLS (restless legs syndrome)   . Wears dentures    partial upper    Past Surgical History:  Procedure Laterality Date  . COLONOSCOPY  2015?   Dr Vira Agar  . HERNIA REPAIR Left 12/01/2005   Dr Bary Castilla.  inguinal  .  INGUINAL HERNIA REPAIR Right 09/05/2017   Medium Ultra Pro mesh Surgeon: Robert Bellow, MD;  Location: [facility];  Service: General;  Laterality: Right;  . LIPOMA EXCISION  09/05/2017   Procedure: EXCISION LIPOMA;  Surgeon: Robert Bellow, MD;  Location: [facility];  Service: General;;  . NASAL TURBINATE REDUCTION Bilateral 01/10/2015   Procedure: Albin Felling REDUCTION/SUBMUCOSAL ;  Surgeon: Beverly Gust, MD;  Location: [place];  Service: ENT;  Laterality: Bilateral;  . SEPTOPLASTY N/A 01/10/2015   Procedure: SEPTOPLASTY;  Surgeon: Beverly Gust, MD;  Location: [place];  Service: ENT;  Laterality: N/A;     Current Outpatient Medications  Medication Sig Dispense Refill  . acetaminophen (TYLENOL) 500 MG tablet Take 1,000 mg by mouth daily as needed for moderate pain or headache.    . Cholecalciferol (VITAMIN D) 2000 units tablet Take 2,000 Units by mouth every other day.    . Cyanocobalamin (B-12) 2500 MCG TABS Take 2,500 mcg by mouth every other day.    . diphenhydrAMINE (BENADRYL) 25 MG tablet Take 25 mg by mouth daily as needed for allergies.    . famotidine (PEPCID) 20 MG tablet Take 20 mg by mouth at bedtime.    . ferrous sulfate 325 (65 FE) MG tablet Take 325 mg by mouth daily.    . finasteride (PROSCAR) 5 MG tablet Take 5  mg by mouth at bedtime.   1  . gabapentin (NEURONTIN) 100 MG capsule Take 100 mg by mouth 2 (two) times daily.     . meloxicam (MOBIC) 7.5 MG tablet Take 7.5 mg by mouth daily.     . Multiple Vitamin (MULTIVITAMIN) capsule Take 1 capsule by mouth daily at 6 PM.     . ranitidine (ZANTAC) 150 MG capsule Take 150 mg by mouth 2 (two) times daily.    Marland Kitchen rOPINIRole (REQUIP) 0.5 MG tablet Take 0.5 mg by mouth at bedtime as needed (for restless leg syndrome).    . tamsulosin (FLOMAX) 0.4 MG CAPS capsule Take 0.4 mg by mouth daily. In the morning after a meal    . zolpidem (AMBIEN) 5 MG tablet Take 2.5 mg by mouth at bedtime as needed for sleep.      No current facility-administered medications for this visit.     Allergies:   Hydrocodone and Pollen extract    Social History:  The patient  reports that he quit smoking about 13 years ago. His smoking use included cigarettes. He smoked 1.00 pack per day. He has never used smokeless tobacco. He reports that he drinks about 7.0 standard drinks of alcohol per week. He reports that he does not use drugs.   Family History:  The patient's family history includes Aneurysm in his father.    ROS:  Please see the history of present illness.   Otherwise, review of systems are positive for none.   All other systems are reviewed and negative.    PHYSICAL EXAM: VS:  BP 126/80 (BP Location: Left Arm, Patient Position: Sitting, Cuff Size: Normal)   Pulse 78   Ht 6' (1.829 m)   Wt 203 lb (92.1 kg)   BMI 27.53 kg/m  , BMI Body mass index is 27.53 kg/m. GEN: Well nourished, well developed, in no acute distress  HEENT: normal  Neck: no JVD, carotid bruits, or masses Cardiac: RRR; no murmurs, rubs, or gallops,no edema  Respiratory:  clear to auscultation bilaterally, normal work of breathing GI: soft, nontender, nondistended, + BS MS: no deformity or atrophy  Skin: warm and dry, no rash Neuro:  Strength and sensation are intact Psych: euthymic mood, full affect   EKG:  EKG is not ordered today.   Recent Labs: 08/31/2017: BUN 28; Creatinine, Ser 1.04; Hemoglobin 15.4; Platelets 191; Potassium 4.1; Sodium 140    Lipid Panel No results found for: CHOL, TRIG, HDL, CHOLHDL, VLDL, LDLCALC, LDLDIRECT    Wt Readings from Last 3 Encounters:  11/08/17 203 lb (92.1 kg)  10/18/17 203 lb (92.1 kg)  09/15/17 200 lb (90.7 kg)       PAD Screen 09/02/2017  Previous PAD dx? No  Previous surgical procedure? No  Pain with walking? No  Feet/toe relief with dangling? No  Painful, non-healing ulcers? No  Extremities discolored? No      ASSESSMENT AND PLAN:   1.  Asymptomatic PVCs:    Nuclear stress test showed no evidence of cardiomyopathy and no evidence of underlying obstructive ischemic heart disease.  The burden of his PVC was also not high and does not require treatment at this point.  He is completely asymptomatic. The patient has no restrictions from a cardiac standpoint.  He should follow-up with me on a yearly basis to ensure stability of PVC burden.   Disposition: Follow-up with me in 1 year.  Signed,  Kathlyn Sacramento, MD  11/08/2017 3:29 PM    [facility] 

## 2017-11-08 NOTE — Patient Instructions (Signed)
Medication Instructions: Your physician recommends that you continue on your current medications as directed. Please refer to the Current Medication list given to you today.  If you need a refill on your cardiac medications before your next appointment, please call your pharmacy.   Follow-Up: Your physician wants you to follow-up in 12 months with Dr. Arida. You will receive a reminder letter in the mail two months in advance. If you don't receive a letter, please call our office at [phone] to schedule this follow-up appointment.  Thank you for choosing [facility]!    

## 2017-12-13 ENCOUNTER — Other Ambulatory Visit: Payer: Self-pay

## 2017-12-13 ENCOUNTER — Ambulatory Visit (INDEPENDENT_AMBULATORY_CARE_PROVIDER_SITE_OTHER): Payer: Medicare Other | Admitting: General Surgery

## 2017-12-13 ENCOUNTER — Encounter: Payer: Self-pay | Admitting: General Surgery

## 2017-12-13 VITALS — BP 132/86 | HR 81 | Resp 14 | Ht 72.0 in | Wt 203.0 lb

## 2017-12-13 DIAGNOSIS — D1722 Benign lipomatous neoplasm of skin and subcutaneous tissue of left arm: Secondary | ICD-10-CM

## 2017-12-13 NOTE — Progress Notes (Signed)
Patient ID: Christopher Aguirre, male   DOB: January 14, 1941, 77 y.o.   MRN: [id number]  Chief Complaint  Patient presents with  . Procedure    HPI Christopher Aguirre is a 77 y.o. male.  Here for excision left forearm lipoma. He is still doing well from his hernia surgery. He does have another lipoma at his right elbow.  HPI  Past Medical History:  Diagnosis Date  . Arthritis    shoulder, collar bone  . GERD (gastroesophageal reflux disease)   . Prostate enlargement   . RLS (restless legs syndrome)   . Wears dentures    partial upper    Past Surgical History:  Procedure Laterality Date  . COLONOSCOPY  2015?   Dr Vira Agar  . HERNIA REPAIR Left 12/01/2005   Dr Bary Castilla.  inguinal  . INGUINAL HERNIA REPAIR Right 09/05/2017   Medium Ultra Pro mesh Surgeon: Robert Bellow, MD;  Location: [facility];  Service: General;  Laterality: Right;  . LIPOMA EXCISION  09/05/2017   Procedure: EXCISION LIPOMA;  Surgeon: Robert Bellow, MD;  Location: [facility];  Service: General;;  . NASAL TURBINATE REDUCTION Bilateral 01/10/2015   Procedure: Albin Felling REDUCTION/SUBMUCOSAL ;  Surgeon: Beverly Gust, MD;  Location: [place];  Service: ENT;  Laterality: Bilateral;  . SEPTOPLASTY N/A 01/10/2015   Procedure: SEPTOPLASTY;  Surgeon: Beverly Gust, MD;  Location: [place];  Service: ENT;  Laterality: N/A;    Family History  Problem Relation Age of Onset  . Aneurysm Father     Social History Social History   Tobacco Use  . Smoking status: Former Smoker    Packs/day: 1.00    Types: Cigarettes    Last attempt to quit: 02/09/2004    Years since quitting: 13.8  . Smokeless tobacco: Never Used  . Tobacco comment: quit 2006  Substance Use Topics  . Alcohol use: Yes    Alcohol/week: 7.0 standard drinks    Types: 7 Cans of beer per week  . Drug use: Never    Allergies  Allergen Reactions  . Hydrocodone Other (See Comments)    Makes patient feel terrible and too woozie  . Pollen  Extract     Respiratory symptoms    Current Outpatient Medications  Medication Sig Dispense Refill  . acetaminophen (TYLENOL) 500 MG tablet Take 1,000 mg by mouth daily as needed for moderate pain or headache.    . Cholecalciferol (VITAMIN D) 2000 units tablet Take 2,000 Units by mouth every other day.    . Cyanocobalamin (B-12) 2500 MCG TABS Take 2,500 mcg by mouth every other day.    . diphenhydrAMINE (BENADRYL) 25 MG tablet Take 25 mg by mouth daily as needed for allergies.    . famotidine (PEPCID) 20 MG tablet Take 20 mg by mouth at bedtime.    . ferrous sulfate 325 (65 FE) MG tablet Take 325 mg by mouth daily.    . finasteride (PROSCAR) 5 MG tablet Take 5 mg by mouth at bedtime.   1  . gabapentin (NEURONTIN) 100 MG capsule Take 100 mg by mouth 2 (two) times daily.     . meloxicam (MOBIC) 7.5 MG tablet Take 7.5 mg by mouth daily.     . Multiple Vitamin (MULTIVITAMIN) capsule Take 1 capsule by mouth daily at 6 PM.     . ranitidine (ZANTAC) 150 MG capsule Take 150 mg by mouth 2 (two) times daily.    Marland Kitchen rOPINIRole (REQUIP) 0.5 MG tablet Take 0.5 mg by  mouth at bedtime as needed (for restless leg syndrome).    . tamsulosin (FLOMAX) 0.4 MG CAPS capsule Take 0.4 mg by mouth daily. In the morning after a meal    . zolpidem (AMBIEN) 5 MG tablet Take 2.5 mg by mouth at bedtime as needed for sleep.     No current facility-administered medications for this visit.     Review of Systems Review of Systems  Constitutional: Negative.   Respiratory: Negative.   Cardiovascular: Negative.     Blood pressure 132/86, pulse 81, resp. rate 14, height 6' (1.829 m), weight 203 lb (92.1 kg), SpO2 99 %.  Physical Exam Physical Exam  Constitutional: He is oriented to person, place, and time. He appears well-developed and well-nourished.  Musculoskeletal:       Arms: Neurological: He is alert and oriented to person, place, and time.  Skin: Skin is warm and dry.  Right lipoma near elbow  Psychiatric:  His behavior is normal.    Data Reviewed The procedure was reviewed and the patient was amenable to proceed.  10 cc of 0.5% Xylocaine with 0.25% Marcaine with 1 to 200,000 units of epinephrine was utilized and well-tolerated.  The area was cleansed with ChloraPrep and draped.  A 1.5 cm longitudinal incision was made.  The skin subtenons tissue was divided.  The mass was freed from the surrounding subcutaneous tissue and delivered without incident.  The wound was closed with interrupted 4-0 Vicryl subcuticular sutures.  Benzoin and Steri-Strips followed by Telfa and Tegaderm dressing applied.  Ice pack provided.  Assessment    Symptomatic lipoma of the left forearm.    Plan    Postoperative wound care was reviewed.  Follow-up on as-needed basis.  HPI, Physical Exam, Assessment and Plan have been scribed under the direction and in the presence of Robert Bellow, MD. Karie Fetch, RN  I have completed the exam and reviewed the above documentation for accuracy and completeness.  I agree with the above.  Haematologist has been used and any errors in dictation or transcription are unintentional.  Hervey Ard, M.D., F.A.C.S.  Forest Gleason Jeryl Umholtz 12/14/2017, 11:42 AM

## 2017-12-13 NOTE — Patient Instructions (Signed)
The patient is aware to call back for any questions or concerns. May shower May remove dressing in 2-3 days Steri strips will gradually come off over 2-3 weeks May use an Ice pack as needed for comfort  

## 2017-12-14 DIAGNOSIS — D1722 Benign lipomatous neoplasm of skin and subcutaneous tissue of left arm: Secondary | ICD-10-CM | POA: Insufficient documentation

## 2017-12-16 ENCOUNTER — Telehealth: Payer: Self-pay | Admitting: *Deleted

## 2017-12-16 NOTE — Telephone Encounter (Signed)
Notified patient as instructed, patient agrees.  

## 2017-12-16 NOTE — Telephone Encounter (Signed)
-----   Message from Robert Bellow, MD sent at 12/16/2017  8:38 AM EST ----- Please notify the patient the report was fine: fat (lipoma) as expected. Thanks.  ----- Message ----- From: Interface, Lab In [facility] Sent: 12/15/2017   7:16 PM EST To: Robert Bellow, MD

## 2018-09-26 DIAGNOSIS — N400 Enlarged prostate without lower urinary tract symptoms: Secondary | ICD-10-CM | POA: Insufficient documentation

## 2018-09-26 DIAGNOSIS — G2581 Restless legs syndrome: Secondary | ICD-10-CM | POA: Insufficient documentation

## 2018-09-26 DIAGNOSIS — K219 Gastro-esophageal reflux disease without esophagitis: Secondary | ICD-10-CM | POA: Insufficient documentation

## 2018-09-26 DIAGNOSIS — G4700 Insomnia, unspecified: Secondary | ICD-10-CM | POA: Insufficient documentation

## 2018-09-27 ENCOUNTER — Encounter: Payer: Self-pay | Admitting: Podiatry

## 2018-09-27 ENCOUNTER — Ambulatory Visit: Payer: Medicare Other

## 2018-09-27 ENCOUNTER — Ambulatory Visit (INDEPENDENT_AMBULATORY_CARE_PROVIDER_SITE_OTHER): Payer: Medicare Other | Admitting: Podiatry

## 2018-09-27 ENCOUNTER — Other Ambulatory Visit: Payer: Self-pay

## 2018-09-27 VITALS — Temp 97.6°F

## 2018-09-27 DIAGNOSIS — M722 Plantar fascial fibromatosis: Secondary | ICD-10-CM

## 2018-09-27 DIAGNOSIS — Q828 Other specified congenital malformations of skin: Secondary | ICD-10-CM | POA: Diagnosis not present

## 2018-09-27 DIAGNOSIS — M7752 Other enthesopathy of left foot: Secondary | ICD-10-CM

## 2018-09-27 NOTE — Addendum Note (Signed)
Addended by: Graceann Congress D on: 09/27/2018 04:12 PM   Modules accepted: Orders

## 2018-09-27 NOTE — Progress Notes (Signed)
Subjective:  Patient ID: Christopher Aguirre, male    DOB: 10-02-1940,  MRN: [id number] HPI Chief Complaint  Patient presents with  . Callouses    Patient presents today for painful callous lesion bottom of left 5th met x years off and on.  He states "somes times its a nagging burnning sensation and it comes and goes at random times"  He has not done anything to treat area    78 y.o. male presents with the above complaint.   ROS: Denies fever chills nausea vomiting muscle aches pains calf pain back pain chest pain shortness of breath.  Past Medical History:  Diagnosis Date  . Arthritis    shoulder, collar bone  . GERD (gastroesophageal reflux disease)   . Prostate enlargement   . RLS (restless legs syndrome)   . Wears dentures    partial upper   Past Surgical History:  Procedure Laterality Date  . COLONOSCOPY  2015?   Dr Vira Agar  . HERNIA REPAIR Left 12/01/2005   Dr Bary Castilla.  inguinal  . INGUINAL HERNIA REPAIR Right 09/05/2017   Medium Ultra Pro mesh Surgeon: Robert Bellow, MD;  Location: [facility];  Service: General;  Laterality: Right;  . LIPOMA EXCISION  09/05/2017   Procedure: EXCISION LIPOMA;  Surgeon: Robert Bellow, MD;  Location: [facility];  Service: General;;  . NASAL TURBINATE REDUCTION Bilateral 01/10/2015   Procedure: Albin Felling REDUCTION/SUBMUCOSAL ;  Surgeon: Beverly Gust, MD;  Location: [place];  Service: ENT;  Laterality: Bilateral;  . SEPTOPLASTY N/A 01/10/2015   Procedure: SEPTOPLASTY;  Surgeon: Beverly Gust, MD;  Location: [place];  Service: ENT;  Laterality: N/A;    Current Outpatient Medications:  .  acetaminophen (TYLENOL) 500 MG tablet, Take 1,000 mg by mouth daily as needed for moderate pain or headache., Disp: , Rfl:  .  Cyanocobalamin (B-12) 2500 MCG TABS, Take 2,500 mcg by mouth every other day., Disp: , Rfl:  .  diphenhydrAMINE (BENADRYL) 25 MG tablet, Take 25 mg by mouth daily as needed for allergies., Disp: , Rfl:  .   famotidine (PEPCID) 20 MG tablet, Take 20 mg by mouth at bedtime., Disp: , Rfl:  .  ferrous sulfate 325 (65 FE) MG tablet, Take 325 mg by mouth daily., Disp: , Rfl:  .  finasteride (PROSCAR) 5 MG tablet, Take 5 mg by mouth at bedtime. , Disp: , Rfl: 1 .  gabapentin (NEURONTIN) 100 MG capsule, Take 100 mg by mouth 2 (two) times daily. , Disp: , Rfl:  .  meloxicam (MOBIC) 7.5 MG tablet, Take 7.5 mg by mouth daily. , Disp: , Rfl:  .  Multiple Vitamin (MULTIVITAMIN) capsule, Take 1 capsule by mouth daily at 6 PM. , Disp: , Rfl:  .  ranitidine (ZANTAC) 150 MG capsule, Take 150 mg by mouth 2 (two) times daily., Disp: , Rfl:  .  rOPINIRole (REQUIP) 0.5 MG tablet, Take 0.5 mg by mouth at bedtime as needed (for restless leg syndrome)., Disp: , Rfl:  .  tamsulosin (FLOMAX) 0.4 MG CAPS capsule, Take 0.4 mg by mouth daily. In the morning after a meal, Disp: , Rfl:  .  zolpidem (AMBIEN) 5 MG tablet, Take 2.5 mg by mouth at bedtime as needed for sleep., Disp: , Rfl:   Allergies  Allergen Reactions  . Hydrocodone Other (See Comments)    Makes patient feel terrible and too woozie  . Pollen Extract     Respiratory symptoms   Review of Systems Objective:  Vitals:   09/27/18 0852  Temp: 97.6 F (36.4 C)    General: Well developed, nourished, in no acute distress, alert and oriented x3   Dermatological: Skin is warm, dry and supple bilateral. Nails x 10 are well maintained; remaining integument appears unremarkable at this time. There are no open sores, no preulcerative lesions, no rash or signs of infection present.  Vascular: Dorsalis Pedis artery and Posterior Tibial artery pedal pulses are 2/4 bilateral with immedate capillary fill time. Pedal hair growth present. No varicosities and no lower extremity edema present bilateral.   Neruologic: Grossly intact via light touch bilateral. Vibratory intact via tuning fork bilateral. Protective threshold with Semmes Wienstein monofilament intact to all  pedal sites bilateral. Patellar and Achilles deep tendon reflexes 2+ bilateral. No Babinski or clonus noted bilateral.   Musculoskeletal: No gross boney pedal deformities bilateral. No pain, crepitus, or limitation noted with foot and ankle range of motion bilateral. Muscular strength 5/5 in all groups tested bilateral.  Plantarflexed fifth metatarsal of the left foot resulting in porokeratotic lesion and overlying bursitis.  Gait: Unassisted, Nonantalgic.    Radiographs:  None taken  Assessment & Plan:   Assessment: Porokeratotic lesion overlying a bursitis fifth metatarsal head left.  Plan: I injected the bursa today with 2 mg of dexamethasone after sterile Betadine skin prep and debrided the porokeratotic lesion.  He states that he is doing much better after that.     Max T. Crown, [place]

## 2019-03-15 ENCOUNTER — Other Ambulatory Visit: Payer: Self-pay

## 2019-03-15 ENCOUNTER — Ambulatory Visit (INDEPENDENT_AMBULATORY_CARE_PROVIDER_SITE_OTHER): Payer: Medicare Other | Admitting: Nurse Practitioner

## 2019-03-15 ENCOUNTER — Encounter: Payer: Self-pay | Admitting: Nurse Practitioner

## 2019-03-15 VITALS — BP 132/88 | HR 73 | Ht 72.0 in | Wt 195.2 lb

## 2019-03-15 DIAGNOSIS — I493 Ventricular premature depolarization: Secondary | ICD-10-CM

## 2019-03-15 DIAGNOSIS — I452 Bifascicular block: Secondary | ICD-10-CM

## 2019-03-15 DIAGNOSIS — R0602 Shortness of breath: Secondary | ICD-10-CM | POA: Diagnosis not present

## 2019-03-15 DIAGNOSIS — I4510 Unspecified right bundle-branch block: Secondary | ICD-10-CM | POA: Diagnosis not present

## 2019-03-15 DIAGNOSIS — R079 Chest pain, unspecified: Secondary | ICD-10-CM

## 2019-03-15 NOTE — Patient Instructions (Signed)
Medication Instructions:  Your physician recommends that you continue on your current medications as directed. Please refer to the Current Medication list given to you today.  *If you need a refill on your cardiac medications before your next appointment, please call your pharmacy*  Lab Work: None ordered  If you have labs (blood work) drawn today and your tests are completely normal, you will receive your results only by: Marland Kitchen MyChart Message (if you have MyChart) OR . A paper copy in the mail If you have any lab test that is abnormal or we need to change your treatment, we will call you to review the results.  Testing/Procedures: None ordered   Follow-Up: At [facility], you and your health needs are our priority.  As part of our continuing mission to provide you with exceptional heart care, we have created designated Provider Care Teams.  These Care Teams include your primary Cardiologist (physician) and Advanced Practice Providers (APPs -  Physician Assistants and Nurse Practitioners) who all work together to provide you with the care you need, when you need it.  Your next appointment:   12 month(s)  The format for your next appointment:   In Person  Provider:    You may see Kathlyn Sacramento, MD or Murray Hodgkins, NP.

## 2019-03-15 NOTE — Progress Notes (Signed)
Office Visit    Patient Name: Christopher Aguirre Date of Encounter: 03/15/2019  Primary Care Provider:  Tracie Harrier, MD Primary Cardiologist:  Kathlyn Sacramento, MD  Chief Complaint    79 year old male with a history of asymptomatic PVCs and nonischemic stress testing (2019), BPH, GERD, and restless leg syndrome, who presents for follow-up of PVCs.  Past Medical History    Past Medical History:  Diagnosis Date  . Arthritis    shoulder, collar bone  . Asymptomatic PVCs    a. 09/2017 Holter: 1% PVC burden; b. 10/2017 MV: Ex time: 7:00. EF 63%, rare PVCs. No ischemia/infarct.  Marland Kitchen GERD (gastroesophageal reflux disease)   . Prostate enlargement   . RBBB   . RLS (restless legs syndrome)   . Wears dentures    partial upper   Past Surgical History:  Procedure Laterality Date  . COLONOSCOPY  2015?   Dr Vira Agar  . HERNIA REPAIR Left 12/01/2005   Dr Bary Castilla.  inguinal  . INGUINAL HERNIA REPAIR Right 09/05/2017   Medium Ultra Pro mesh Surgeon: Robert Bellow, MD;  Location: [facility];  Service: General;  Laterality: Right;  . LIPOMA EXCISION  09/05/2017   Procedure: EXCISION LIPOMA;  Surgeon: Robert Bellow, MD;  Location: [facility];  Service: General;;  . NASAL TURBINATE REDUCTION Bilateral 01/10/2015   Procedure: Albin Felling REDUCTION/SUBMUCOSAL ;  Surgeon: Beverly Gust, MD;  Location: [place];  Service: ENT;  Laterality: Bilateral;  . SEPTOPLASTY N/A 01/10/2015   Procedure: SEPTOPLASTY;  Surgeon: Beverly Gust, MD;  Location: [place];  Service: ENT;  Laterality: N/A;    Allergies  Allergies  Allergen Reactions  . Hydrocodone Other (See Comments)    Makes patient feel terrible and too woozie  . Pollen Extract     Respiratory symptoms    History of Present Illness    79 year old male with a history of asymptomatic PVCs, BPH, GERD, and restless leg syndrome.  In 2019, he was being evaluated for hernia surgery and was noted to have frequent PVCs.   He underwent Holter monitoring which showed a 1% PVC burden.  This was followed by stress testing which was nonischemic.  He was able to walk 7 minutes on the treadmill.  He subsequently underwent hernia surgery without complication.  He was last seen in clinic in October 2019, at which time he continued to do well.  Since then, he has continued to exercise regularly.  In the setting of COVID-19, he no longer goes to his gym but built himself a weight bench and has some free weights in his basement and he lifts weights regularly.  He is no longer doing any aerobic activity but has intentionally lost about 10 pounds over the past year or so by watching his diet.  He denies chest pain, dyspnea, palpitations, PND, orthopnea, dizziness, syncope, edema, or early satiety.  Home Medications    Prior to Admission medications   Medication Sig Start Date End Date Taking? Authorizing Provider  acetaminophen (TYLENOL) 500 MG tablet Take 1,000 mg by mouth daily as needed for moderate pain or headache.   Yes [provider]  Cyanocobalamin (B-12) 2500 MCG TABS Take 2,500 mcg by mouth every other day.   Yes [provider]  diphenhydrAMINE (BENADRYL) 25 MG tablet Take 25 mg by mouth daily as needed for allergies.   Yes [provider]  famotidine (PEPCID) 20 MG tablet Take 20 mg by mouth at bedtime.   Yes [provider]  ferrous sulfate 325 (65 FE) MG tablet Take 325 mg by mouth daily.   Yes [provider]  finasteride (PROSCAR) 5 MG tablet Take 5 mg by mouth at bedtime.  07/13/17  Yes [provider]  gabapentin (NEURONTIN) 100 MG capsule Take 100 mg by mouth 2 (two) times daily.  08/24/17  Yes [provider]  meloxicam (MOBIC) 7.5 MG tablet Take 7.5 mg by mouth daily.    Yes [provider]  Multiple Vitamin (MULTIVITAMIN) capsule Take 1 capsule by mouth daily at 6 PM.    Yes [provider]  ranitidine (ZANTAC) 150 MG capsule Take  150 mg by mouth 2 (two) times daily.   Yes [provider]  rOPINIRole (REQUIP) 0.5 MG tablet Take 0.5 mg by mouth at bedtime as needed (for restless leg syndrome).   Yes [provider]  tamsulosin (FLOMAX) 0.4 MG CAPS capsule Take 0.4 mg by mouth daily. In the morning after a meal   Yes [provider]  zolpidem (AMBIEN) 5 MG tablet Take 2.5 mg by mouth at bedtime as needed for sleep.   Yes [provider]    Review of Systems    He denies chest pain, palpitations, dyspnea, pnd, orthopnea, n, v, dizziness, syncope, edema, weight gain, or early satiety.  All other systems reviewed and are otherwise negative except as noted above.  Physical Exam    VS:  BP 132/88 (BP Location: Left Arm, Patient Position: Sitting, Cuff Size: Normal)   Pulse 73   Ht 6' (1.829 m)   Wt 195 lb 4 oz (88.6 kg)   BMI 26.48 kg/m  , BMI Body mass index is 26.48 kg/m. GEN: Well nourished, well developed, in no acute distress. HEENT: normal. Neck: Supple, no JVD, carotid bruits, or masses. Cardiac: RRR, no murmurs, rubs, or gallops. No clubbing, cyanosis, edema.  Radials/PT 2+ and equal bilaterally.  Respiratory:  Respirations regular and unlabored, clear to auscultation bilaterally. GI: Soft, nontender, nondistended, BS + x 4. MS: no deformity or atrophy. Skin: warm and dry, no rash. Neuro:  Strength and sensation are intact. Psych: Normal affect.  Accessory Clinical Findings    ECG personally reviewed by me today -regular sinus rhythm, 73, left axis deviation, left anterior fascicular block, right bundle branch block- no acute changes.  Lab Results  Component Value Date   WBC 6.3 08/31/2017   HGB 15.4 08/31/2017   HCT 43.8 08/31/2017   MCV 94.9 08/31/2017   PLT 191 08/31/2017   Lab Results  Component Value Date   CREATININE 1.04 08/31/2017   BUN 28 (H) 08/31/2017   NA 140 08/31/2017   K 4.1 08/31/2017   CL 106 08/31/2017   CO2 25 08/31/2017    Assessment &  Plan    1.  Asymptomatic PVCs: No PVCs on ECG today.  Prior work-up included Holter monitoring which showed a 1% PVC burden.  Prior stress testing was nonischemic.  He continues to exercise regularly without symptoms or limitations.  No further evaluation warranted today.  2.  Right bundle branch block/bifascicular block: No evidence of high-grade heart block.  He is not on any AV nodal blocking agents.  Continue to follow with annual ECGs.  3.  Disposition: Follow-up in 1 year.   Murray Hodgkins, NP 03/15/2019, 2:59 PM

## 2019-04-19 ENCOUNTER — Other Ambulatory Visit: Payer: Self-pay | Admitting: General Surgery

## 2019-04-20 ENCOUNTER — Encounter
Admission: RE | Admit: 2019-04-20 | Discharge: 2019-04-20 | Disposition: A | Discharge status: Home / Self Care | Payer: Medicare Other | Source: Ambulatory Visit | Attending: General Surgery | Admitting: General Surgery

## 2019-04-20 ENCOUNTER — Other Ambulatory Visit: Payer: Self-pay

## 2019-04-20 NOTE — Pre-Procedure Instructions (Signed)
Cardiology note in [place] 03/15/19 along with EKG. Cardiologist states "no changes" pt to f/u in 1 year.

## 2019-04-20 NOTE — Patient Instructions (Signed)
Your procedure is scheduled on: 04/25/19 Report to [place]. To find out your arrival time please call [phone] between 1PM - 3PM on 04/24/19.  Remember: Instructions that are not followed completely may result in serious medical risk, up to and including death, or upon the discretion of your surgeon and anesthesiologist your surgery may need to be rescheduled.     _X__ 1. Do not eat food after midnight the night before your procedure.                 No gum chewing or hard candies. You may drink clear liquids up to 2 hours                 before you are scheduled to arrive for your surgery- DO not drink clear                 liquids within 2 hours of the start of your surgery.                 Clear Liquids include:  water, apple juice without pulp, clear carbohydrate                 drink such as Clearfast or Gatorade, Black Coffee or Tea (Do not add                 anything to coffee or tea). Diabetics water only  __X__2.  On the morning of surgery brush your teeth with toothpaste and water, you                 may rinse your mouth with mouthwash if you wish.  Do not swallow any              toothpaste of mouthwash.     _X__ 3.  No Alcohol for 24 hours before or after surgery.   _X__ 4.  Do Not Smoke or use e-cigarettes For 24 Hours Prior to Your Surgery.                 Do not use any chewable tobacco products for at least 6 hours prior to                 surgery.  ____  5.  Bring all medications with you on the day of surgery if instructed.   __X__  6.  Notify your doctor if there is any change in your medical condition      (cold, fever, infections).     Do not wear jewelry, make-up, hairpins, clips or nail polish. Do not wear lotions, powders, or perfumes.  Do not shave 48 hours prior to surgery. Men may shave face and neck. Do not bring valuables to the hospital.    [facility] is not responsible for any belongings or  valuables.  Contacts, dentures/partials or body piercings may not be worn into surgery. Bring a case for your contacts, glasses or hearing aids, a denture cup will be supplied. Leave your suitcase in the car. After surgery it may be brought to your room. For patients admitted to the hospital, discharge time is determined by your treatment team.   Patients discharged the day of surgery will not be allowed to drive home.   Please read over the following fact sheets that you were given:   MRSA Information  __X__ Take these medicines the morning of surgery with A SIP OF WATER:  1. cimetidine (TAGAMET) 200 MG tablet  2. gabapentin (NEURONTIN) 100 MG capsule  3. loratadine (CLARITIN) 10 MG tablet  4. tamsulosin (FLOMAX) 0.4 MG CAPS capsule  5.  6.  ____ Fleet Enema (as directed)   __X__ Use CHG Soap/SAGE wipes as directed  ____ Use inhalers on the day of surgery  ____ Stop metformin/Janumet/Farxiga 2 days prior to surgery    ____ Take 1/2 of usual insulin dose the night before surgery. No insulin the morning          of surgery.   ____ Stop Blood Thinners Coumadin/Plavix/Xarelto/Pleta/Pradaxa/Eliquis/Effient/Aspirin  on   Or contact your Surgeon, Cardiologist or Medical Doctor regarding  ability to stop your blood thinners  __X__ Stop Anti-inflammatories 7 days before surgery such as Advil, Ibuprofen, Motrin,  BC or Goodies Powder, Naprosyn, Naproxen, Aleve, Aspirin    __X__ Stop all herbal supplements, fish oil or vitamin E until after surgery.    ____ Bring C-Pap to the hospital.    Telephone instructions 04/20/19 to stop Meloxicam until after surgery. Tylenol may be used if needed.

## 2019-04-23 ENCOUNTER — Other Ambulatory Visit: Payer: Self-pay

## 2019-04-23 ENCOUNTER — Other Ambulatory Visit
Admission: RE | Admit: 2019-04-23 | Discharge: 2019-04-23 | Disposition: A | Discharge status: Home / Self Care | Payer: Medicare Other | Source: Ambulatory Visit | Attending: General Surgery | Admitting: General Surgery

## 2019-04-23 DIAGNOSIS — Z20822 Contact with and (suspected) exposure to covid-19: Secondary | ICD-10-CM | POA: Insufficient documentation

## 2019-04-23 DIAGNOSIS — Z01812 Encounter for preprocedural laboratory examination: Secondary | ICD-10-CM | POA: Insufficient documentation

## 2019-04-23 LAB — SARS CORONAVIRUS 2 (TAT 6-24 HRS): SARS Coronavirus 2: NEGATIVE

## 2019-04-25 ENCOUNTER — Ambulatory Visit
Admission: RE | Admit: 2019-04-25 | Discharge: 2019-04-25 | Disposition: A | Discharge status: Home / Self Care | Payer: Medicare Other | Attending: General Surgery | Admitting: General Surgery

## 2019-04-25 ENCOUNTER — Encounter
Admission: RE | Disposition: A | Discharge status: Home / Self Care | Payer: Self-pay | Source: Home / Self Care | Attending: General Surgery

## 2019-04-25 ENCOUNTER — Encounter (HOSPITAL_COMMUNITY): Payer: Self-pay | Admitting: Certified Registered Nurse Anesthetist

## 2019-04-25 ENCOUNTER — Encounter: Payer: Self-pay | Admitting: General Surgery

## 2019-04-25 ENCOUNTER — Other Ambulatory Visit: Payer: Self-pay

## 2019-04-25 DIAGNOSIS — R238 Other skin changes: Secondary | ICD-10-CM | POA: Insufficient documentation

## 2019-04-25 DIAGNOSIS — K4091 Unilateral inguinal hernia, without obstruction or gangrene, recurrent: Secondary | ICD-10-CM | POA: Diagnosis not present

## 2019-04-25 DIAGNOSIS — Z538 Procedure and treatment not carried out for other reasons: Secondary | ICD-10-CM | POA: Diagnosis not present

## 2019-04-25 SURGERY — REPAIR, HERNIA, INGUINAL, ADULT
Anesthesia: General | Laterality: Right

## 2019-04-25 MED ORDER — CEFAZOLIN SODIUM-DEXTROSE 2-4 GM/100ML-% IV SOLN: 2.0000 g | INTRAVENOUS | Status: DC

## 2019-04-25 MED ORDER — LACTATED RINGERS IV SOLN: INTRAVENOUS | Status: DC

## 2019-04-25 MED ORDER — VALACYCLOVIR HCL 1 G PO TABS: 1000.0000 mg | ORAL_TABLET | Freq: Three times a day (TID) | ORAL | 0 refills | Status: AC

## 2019-04-25 MED ORDER — GABAPENTIN 100 MG PO CAPS: 100.0000 mg | ORAL_CAPSULE | Freq: Three times a day (TID) | ORAL | 0 refills | Status: DC

## 2019-04-25 MED ORDER — PREDNISONE 10 MG PO TABS: 10.0000 mg | ORAL_TABLET | Freq: Every day | ORAL | 0 refills | Status: DC

## 2019-04-25 SURGICAL SUPPLY — 32 items
BLADE SURG 15 STRL SS SAFETY (BLADE) ×4 IMPLANT
CANISTER SUCT 1200ML W/VALVE (MISCELLANEOUS) ×2 IMPLANT
CHLORAPREP W/TINT 26 (MISCELLANEOUS) ×2 IMPLANT
COVER WAND RF STERILE (DRAPES) ×2 IMPLANT
DECANTER SPIKE VIAL GLASS SM (MISCELLANEOUS) ×2 IMPLANT
DRAIN PENROSE 1/4X12 LTX STRL (WOUND CARE) ×2 IMPLANT
DRAPE LAPAROTOMY 100X77 ABD (DRAPES) ×2 IMPLANT
DRSG TEGADERM 4X4.75 (GAUZE/BANDAGES/DRESSINGS) ×2 IMPLANT
DRSG TELFA 4X3 1S NADH ST (GAUZE/BANDAGES/DRESSINGS) ×2 IMPLANT
ELECT REM PT RETURN 9FT ADLT (ELECTROSURGICAL) ×2
ELECTRODE REM PT RTRN 9FT ADLT (ELECTROSURGICAL) ×1 IMPLANT
GLOVE BIO SURGEON STRL SZ7.5 (GLOVE) ×2 IMPLANT
GLOVE INDICATOR 8.0 STRL GRN (GLOVE) ×2 IMPLANT
GOWN STRL REUS W/ TWL LRG LVL3 (GOWN DISPOSABLE) ×2 IMPLANT
GOWN STRL REUS W/TWL LRG LVL3 (GOWN DISPOSABLE) ×2
KIT TURNOVER KIT A (KITS) ×2 IMPLANT
LABEL OR SOLS (LABEL) ×2 IMPLANT
NEEDLE HYPO 22GX1.5 SAFETY (NEEDLE) ×4 IMPLANT
PACK BASIN MINOR ARMC (MISCELLANEOUS) ×2 IMPLANT
STRIP CLOSURE SKIN 1/2X4 (GAUZE/BANDAGES/DRESSINGS) ×2 IMPLANT
SUT PDS AB 0 CT1 27 (SUTURE) ×2 IMPLANT
SUT SURGILON 0 BLK (SUTURE) ×4 IMPLANT
SUT VIC AB 2-0 SH 27 (SUTURE) ×1
SUT VIC AB 2-0 SH 27XBRD (SUTURE) ×1 IMPLANT
SUT VIC AB 3-0 54X BRD REEL (SUTURE) ×1 IMPLANT
SUT VIC AB 3-0 BRD 54 (SUTURE) ×1
SUT VIC AB 3-0 SH 27 (SUTURE) ×1
SUT VIC AB 3-0 SH 27X BRD (SUTURE) ×1 IMPLANT
SUT VIC AB 4-0 FS2 27 (SUTURE) ×2 IMPLANT
SWABSTK COMLB BENZOIN TINCTURE (MISCELLANEOUS) ×2 IMPLANT
SYR 10ML LL (SYRINGE) ×2 IMPLANT
SYR 3ML LL SCALE MARK (SYRINGE) ×2 IMPLANT

## 2019-04-25 NOTE — H&P (Signed)
Christopher Aguirre [id number] [birth year]     HPI: 79 year old male for planned recurrent right inguinal hernia surgery. Called two days ago reporting burning sensation along the old scar had started 2 days prior to call .  Today, reports he noted some vesicles 2 days ago along the area of the scar, right buttock and right proximal thigh.   Medications Prior to Admission  Medication Sig Dispense Refill Last Dose  . acetaminophen (TYLENOL) 500 MG tablet Take 1,000 mg by mouth daily as needed for moderate pain or headache.   04/24/2019 at Unknown time  . Calcium-Magnesium-Zinc (CAL-MAG-ZINC PO) Take 2 tablets by mouth daily.   Past Week at Unknown time  . Cholecalciferol (VITAMIN D) 50 MCG (2000 UT) tablet Take 2,000 Units by mouth daily.   Past Week at Unknown time  . cimetidine (TAGAMET) 200 MG tablet Take 200 mg by mouth daily.   04/25/2019 at Unknown time  . Cyanocobalamin (B-12) 5000 MCG CAPS Take 5,000 mcg by mouth daily.   Past Week at Unknown time  . famotidine (PEPCID) 20 MG tablet Take 20 mg by mouth at bedtime.   04/24/2019 at Unknown time  . ferrous sulfate 325 (65 FE) MG tablet Take 325 mg by mouth daily.   Past Week at Unknown time  . finasteride (PROSCAR) 5 MG tablet Take 5 mg by mouth at bedtime.   1 04/24/2019 at Unknown time  . loratadine (CLARITIN) 10 MG tablet Take 10 mg by mouth daily.   04/25/2019 at Unknown time  . meloxicam (MOBIC) 7.5 MG tablet Take 7.5 mg by mouth 2 (two) times daily as needed.    Past Week at Unknown time  . Multiple Vitamin (MULTIVITAMIN) capsule Take 1 capsule by mouth daily.    Past Week at Unknown time  . rOPINIRole (REQUIP) 0.5 MG tablet Take 0.5 mg by mouth at bedtime as needed (for restless leg syndrome).   Past Week at Unknown time  . tamsulosin (FLOMAX) 0.4 MG CAPS capsule Take 0.8 mg by mouth daily.    04/25/2019 at Unknown time  . zolpidem (AMBIEN) 5 MG tablet Take 2.5 mg by mouth at bedtime as needed for sleep.   04/24/2019 at Unknown time  .  [DISCONTINUED] gabapentin (NEURONTIN) 100 MG capsule Take 100 mg by mouth 2 (two) times daily.    04/25/2019 at Unknown time   Allergies  Allergen Reactions  . Hydrocodone Other (See Comments)    Makes patient feel terrible and too woozie  . Pollen Extract     Respiratory symptoms   Past Medical History:  Diagnosis Date  . Arthritis    shoulder, collar bone  . Asymptomatic PVCs    a. 09/2017 Holter: 1% PVC burden; b. 10/2017 MV: Ex time: 7:00. EF 63%, rare PVCs. No ischemia/infarct.  Marland Kitchen GERD (gastroesophageal reflux disease)   . Prostate enlargement   . RBBB   . RLS (restless legs syndrome)   . Wears dentures    partial upper   Past Surgical History:  Procedure Laterality Date  . COLONOSCOPY  2015?   Dr Vira Agar  . HERNIA REPAIR Left 12/01/2005   Dr Bary Castilla.  inguinal  . INGUINAL HERNIA REPAIR Right 09/05/2017   Medium Ultra Pro mesh Surgeon: Robert Bellow, MD;  Location: [facility];  Service: General;  Laterality: Right;  . LIPOMA EXCISION  09/05/2017   Procedure: EXCISION LIPOMA;  Surgeon: Robert Bellow, MD;  Location: [facility];  Service: General;;  . NASAL TURBINATE REDUCTION Bilateral  01/10/2015   Procedure: TURBINATE REDUCTION/SUBMUCOSAL ;  Surgeon: Beverly Gust, MD;  Location: [place];  Service: ENT;  Laterality: Bilateral;  . SEPTOPLASTY N/A 01/10/2015   Procedure: SEPTOPLASTY;  Surgeon: Beverly Gust, MD;  Location: [place];  Service: ENT;  Laterality: N/A;   Social History   Socioeconomic History  . Marital status: Married    Spouse name: Not on file  . Number of children: Not on file  . Years of education: Not on file  . Highest education level: Not on file  Occupational History  . Not on file  Tobacco Use  . Smoking status: Former Smoker    Packs/day: 1.00    Types: Cigarettes    Quit date: 02/09/2004    Years since quitting: 15.2  . Smokeless tobacco: Never Used  . Tobacco comment: quit 2006  Substance and Sexual Activity   . Alcohol use: Yes    Alcohol/week: 7.0 standard drinks    Types: 7 Cans of beer per week  . Drug use: Never  . Sexual activity: Not on file  Other Topics Concern  . Not on file  Social History Narrative  . Not on file   Social Determinants of Health   Financial Resource Strain:   . Difficulty of Paying Living Expenses:   Food Insecurity:   . Worried About Charity fundraiser in the Last Year:   . Arboriculturist in the Last Year:   Transportation Needs:   . Film/video editor (Medical):   Marland Kitchen Lack of Transportation (Non-Medical):   Physical Activity:   . Days of Exercise per Week:   . Minutes of Exercise per Session:   Stress:   . Feeling of Stress :   Social Connections:   . Frequency of Communication with Friends and Family:   . Frequency of Social Gatherings with Friends and Family:   . Attends Religious Services:   . Active Member of Clubs or Organizations:   . Attends Archivist Meetings:   Marland Kitchen Marital Status:   Intimate Partner Violence:   . Fear of Current or Ex-Partner:   . Emotionally Abused:   Marland Kitchen Physically Abused:   . Sexually Abused:    Social History   Social History Narrative  . Not on file     ROS: Negative.     PE: Vesicular rash involving the T12, L1 dermatome consistent with shingles.    Assessment/Plan:    Planned hernia surgery will be cancelled.  Discussed with Dr. Ginette Pitman: RX with Valtrex: 1000 mg TID, Prednisone taper (60 tapering by 10 / day). He will increase neurontin to 100 mg TID.  He will call the office when the vesicles have resolved.    [facility] 04/25/2019       .

## 2019-04-25 NOTE — Progress Notes (Signed)
Prior to shave pt noted with blister/sore outbreak near buttocks and covering groin area on right side. Consistent appearance of Shingles, notified Dr. Bary Castilla who assessed and pt surgery cancelled due to sores near operative site. Pt verbalized understanding. Will follow up with primary care for treatment. Pt escorted to lobby with all belongings.

## 2020-02-20 ENCOUNTER — Other Ambulatory Visit: Payer: Self-pay | Admitting: General Surgery

## 2020-02-20 NOTE — Progress Notes (Signed)
Subjective:     Patient ID: Christopher Aguirre is a 80 y.o. male.  HPI  The following portions of the patient's history were reviewed and updated as appropriate.  This an established patient is here today for: office visit. He is here to have right inguinal hernia re-evaluated and discuss surgery. He states it seems to bother him more later in the day.  Review of Systems  Constitutional: Negative for chills and fever.  Respiratory: Negative for cough.        Objective:   Physical Exam Constitutional:      Appearance: Normal appearance.  Cardiovascular:     Rate and Rhythm: Normal rate and regular rhythm.     Pulses: Normal pulses.     Heart sounds: Normal heart sounds.  Pulmonary:     Effort: Pulmonary effort is normal.     Breath sounds: Normal breath sounds.  Abdominal:     Hernia: A hernia is present. Hernia is present in the right inguinal area.  Genitourinary:      Comments: Bulge just lateral to the pubic tubercle, reducible in the supine and standing position. Skin:    General: Skin is warm and dry.  Neurological:     Mental Status: He is alert and oriented to person, place, and time.  Psychiatric:        Mood and Affect: Mood normal.        Behavior: Behavior normal.   Operative report of 09/05/2017:  Indirect inguinal hernia repaired with medium Ultra Pro mesh.  Dense scarring noted.  No notation of a direct defect.    Assessment:     Recurrent right inguinal hernia.    Plan:     Options for repair reviewed: One) anterior approach versus two) robotic/laparoscopic (referral to another physician).  Pros and cons of each reviewed.  With the bilayer mesh I think he will be best served by an open repair.  Risks of surgery reviewed.       Entered by Karie Fetch, RN, acting as a scribe for Dr. Hervey Ard, MD.  The documentation recorded by the scribe accurately reflects the service I personally performed and the decisions made by me.    Robert Bellow, MD FACS

## 2020-02-21 ENCOUNTER — Other Ambulatory Visit: Payer: Self-pay

## 2020-02-21 ENCOUNTER — Encounter: Payer: Self-pay | Admitting: Cardiovascular Disease

## 2020-02-21 ENCOUNTER — Ambulatory Visit (INDEPENDENT_AMBULATORY_CARE_PROVIDER_SITE_OTHER): Payer: Medicare Other | Admitting: Cardiovascular Disease

## 2020-02-21 VITALS — BP 144/98 | HR 74 | Ht 72.0 in | Wt 196.0 lb

## 2020-02-21 DIAGNOSIS — Z01810 Encounter for preprocedural cardiovascular examination: Secondary | ICD-10-CM | POA: Diagnosis not present

## 2020-02-21 DIAGNOSIS — I493 Ventricular premature depolarization: Secondary | ICD-10-CM

## 2020-02-21 NOTE — Patient Instructions (Signed)
Medication Instructions:  Your physician recommends that you continue on your current medications as directed. Please refer to the Current Medication list given to you today.  *If you need a refill on your cardiac medications before your next appointment, please call your pharmacy*   Lab Work: None ordered If you have labs (blood work) drawn today and your tests are completely normal, you will receive your results only by: Marland Kitchen MyChart Message (if you have MyChart) OR . A paper copy in the mail If you have any lab test that is abnormal or we need to change your treatment, we will call you to review the results.   Testing/Procedures: None ordered   Follow-Up: At [facility], you and your health needs are our priority.  As part of our continuing mission to provide you with exceptional heart care, we have created designated Provider Care Teams.  These Care Teams include your primary Cardiologist (physician) and Advanced Practice Providers (APPs -  Physician Assistants and Nurse Practitioners) who all work together to provide you with the care you need, when you need it.  We recommend signing up for the patient portal called "MyChart".  Sign up information is provided on this After Visit Summary.  MyChart is used to connect with patients for Virtual Visits (Telemedicine).  Patients are able to view lab/test results, encounter notes, upcoming appointments, etc.  Non-urgent messages can be sent to your provider as well.   To learn more about what you can do with MyChart, go to [email or web address].    Your next appointment:   As needed   The format for your next appointment:   In Person  Provider:   You may see Kathlyn Sacramento, MD or one of the following Advanced Practice Providers on your designated Care Team:    Murray Hodgkins, NP  Christell Faith, PA-C  Marrianne Mood, PA-C  Cadence Montaqua, [place]  Laurann [place], NP    Other Instructions You are considered low risk from a  cardiac standpoint for your upcoming hernia surgery.

## 2020-02-21 NOTE — Progress Notes (Signed)
Cardiology Office Note   Date:  02/21/2020   ID:  Christopher Aguirre, DOB May 19, 1940, MRN [id number]  PCP:  Tracie Harrier, MD  Cardiologist:   Kathlyn Sacramento, MD   Chief Complaint  Patient presents with  . Follow-up    Annual follow up and cardiac clearance. Medications verbally reviewed with patient.       History of Present Illness: Christopher Aguirre is a 80 y.o. male who is here today for follow-up visit regarding asymptomatic PVCs and Cardiovascular evaluation for hernia surgery. He has been relatively healthy throughout his life with no diabetes, hypertension hyperlipidemia.  He is a previous smoker and quit about 13 years ago.  There is family history of coronary artery disease but he cannot remember the details. He was seen in 2019 for frequent PVCs.  Holter monitor showed 1500 PVCs in 24 hours.  Treadmill nuclear stress test showed no evidence of ischemia with normal ejection fraction.  Has been doing very well with no recent chest pain, shortness of breath or palpitations.  No dizziness or syncope.  He exercises regularly at the gym at least 3 times a week with no significant limitations.  He is scheduled for hernia surgery at the end of the month.   Past Medical History:  Diagnosis Date  . Arthritis    shoulder, collar bone  . Asymptomatic PVCs    a. 09/2017 Holter: 1% PVC burden; b. 10/2017 MV: Ex time: 7:00. EF 63%, rare PVCs. No ischemia/infarct.  Marland Kitchen GERD (gastroesophageal reflux disease)   . Prostate enlargement   . RBBB   . RLS (restless legs syndrome)   . Wears dentures    partial upper    Past Surgical History:  Procedure Laterality Date  . COLONOSCOPY  2015?   Dr Vira Agar  . HERNIA REPAIR Left 12/01/2005   Dr Bary Castilla.  inguinal  . INGUINAL HERNIA REPAIR Right 09/05/2017   Medium Ultra Pro mesh Surgeon: Robert Bellow, MD;  Location: [facility];  Service: General;  Laterality: Right;  . LIPOMA EXCISION  09/05/2017   Procedure: EXCISION LIPOMA;  Surgeon:  Robert Bellow, MD;  Location: [facility];  Service: General;;  . NASAL TURBINATE REDUCTION Bilateral 01/10/2015   Procedure: [place] REDUCTION/SUBMUCOSAL ;  Surgeon: Beverly Gust, MD;  Location: [place];  Service: ENT;  Laterality: Bilateral;  . SEPTOPLASTY N/A 01/10/2015   Procedure: SEPTOPLASTY;  Surgeon: Beverly Gust, MD;  Location: [place];  Service: ENT;  Laterality: N/A;     Current Outpatient Medications  Medication Sig Dispense Refill  . acetaminophen (TYLENOL) 500 MG tablet Take 1,000 mg by mouth daily as needed for moderate pain or headache.    . b complex vitamins capsule Take 1 capsule by mouth daily.    . Calcium-Magnesium-Zinc (CAL-MAG-ZINC PO) Take 2 tablets by mouth daily.    . Cholecalciferol (VITAMIN D) 50 MCG (2000 UT) tablet Take 2,000 Units by mouth daily.    . cimetidine (TAGAMET) 200 MG tablet Take 200 mg by mouth daily.    . Cyanocobalamin (B-12) 5000 MCG CAPS Take 5,000 mcg by mouth daily.    . famotidine (PEPCID) 20 MG tablet Take 20 mg by mouth at bedtime.    . ferrous sulfate 325 (65 FE) MG tablet Take 325 mg by mouth daily.    . finasteride (PROSCAR) 5 MG tablet Take 5 mg by mouth at bedtime.   1  . gabapentin (NEURONTIN) 100 MG capsule Take 1 capsule (100 mg total) by  mouth 3 (three) times daily. (Patient taking differently: Take 100 mg by mouth 2 (two) times daily.) 1 capsule 0  . loratadine (CLARITIN) 10 MG tablet Take 10 mg by mouth daily.    . meloxicam (MOBIC) 7.5 MG tablet Take 7.5 mg by mouth daily.    . Multiple Vitamin (MULTIVITAMIN) capsule Take 1 capsule by mouth daily.     Marland Kitchen rOPINIRole (REQUIP) 0.5 MG tablet Take 0.5 mg by mouth at bedtime as needed (for restless leg syndrome).    . tamsulosin (FLOMAX) 0.4 MG CAPS capsule Take 0.8 mg by mouth daily.     Marland Kitchen zolpidem (AMBIEN) 5 MG tablet Take 2.5 mg by mouth at bedtime as needed for sleep.     No current facility-administered medications for this visit.     Allergies:   Hydrocodone and Pollen extract    Social History:  The patient  reports that he quit smoking about 16 years ago. His smoking use included cigarettes. He smoked 1.00 pack per day. He has never used smokeless tobacco. He reports current alcohol use of about 7.0 standard drinks of alcohol per week. He reports that he does not use drugs.   Family History:  The patient's family history includes Aneurysm in his father.    ROS:  Please see the history of present illness.   Otherwise, review of systems are positive for none.   All other systems are reviewed and negative.    PHYSICAL EXAM: VS:  Ht 6' (1.829 m)   Wt 196 lb (88.9 kg)   SpO2 97%   BMI 26.58 kg/m  , BMI Body mass index is 26.58 kg/m. GEN: Well nourished, well developed, in no acute distress  HEENT: normal  Neck: no JVD, carotid bruits, or masses Cardiac: RRR; no murmurs, rubs, or gallops,no edema  Respiratory:  clear to auscultation bilaterally, normal work of breathing GI: soft, nontender, nondistended, + BS MS: no deformity or atrophy  Skin: warm and dry, no rash Neuro:  Strength and sensation are intact Psych: euthymic mood, full affect   EKG:  EKG is  ordered today. EKG showed sinus rhythm with first-degree AV block and right bundle branch block.  Recent Labs: No results found for requested labs within last 8760 hours.    Lipid Panel No results found for: CHOL, TRIG, HDL, CHOLHDL, VLDL, LDLCALC, LDLDIRECT    Wt Readings from Last 3 Encounters:  02/21/20 196 lb (88.9 kg)  04/25/19 190 lb (86.2 kg)  04/20/19 190 lb (86.2 kg)       PAD Screen 09/02/2017  Previous PAD dx? No  Previous surgical procedure? No  Pain with walking? No  Feet/toe relief with dangling? No  Painful, non-healing ulcers? No  Extremities discolored? No      ASSESSMENT AND PLAN:   1.  Preop cardiovascular evaluation for hernia surgery: The patient has no symptoms suggestive of angina, heart failure or  arrhythmia.  He exercises regularly with no significant physical limitations.  Based on this, the patient is at low risk from a cardiovascular standpoint without the need for any further ischemic cardiac work-up.  Previous stress test in 2019 was normal.   2. Asymptomatic PVCs:   The patient is completely asymptomatic and it appears that the PVCs have decreased even from before as there is no evidence of PVCs today on EKG or by physical exam.  Even in the past, the burden was overall low.   Based on this, the patient can follow-up with [place] as needed if  he develops cardiac symptoms or hypertension PVC.   Disposition: Follow-up with me as needed.  Signed,  Kathlyn Sacramento, MD  02/21/2020 1:39 PM    [place]

## 2020-02-27 ENCOUNTER — Encounter
Admission: RE | Admit: 2020-02-27 | Discharge: 2020-02-27 | Disposition: A | Discharge status: Home / Self Care | Payer: Medicare Other | Source: Ambulatory Visit | Attending: General Surgery | Admitting: General Surgery

## 2020-02-27 HISTORY — DX: Family history of other specified conditions: Z84.89

## 2020-02-27 NOTE — Progress Notes (Signed)
[facility] Perioperative Services  Pre-Admission/Anesthesia Testing Clinical Review  Date: 03/03/20  Patient Demographics:  Name: Christopher Aguirre DOB:   1940/07/06 MRN:   [id number]  Planned Surgical Procedure(s):    Case: [id number] Date/Time: 03/05/20 1331   Procedure: HERNIA REPAIR INGUINAL ADULT (Right )   Anesthesia type: General   Pre-op diagnosis: recurrent right inguinal hernia   Location: [facility] OR ROOM 06 / [place] [facility]S FOR ANESTHESIA GROUP   Surgeons: Robert Bellow, MD    NOTE: Available PAT nursing documentation and vital signs have been reviewed. Clinical nursing staff has updated patient's PMH/PSHx, current medication list, and drug allergies/intolerances to ensure comprehensive history available to assist in medical decision making as it pertains to the aforementioned surgical procedure and anticipated anesthetic course.   Clinical Discussion:  Christopher Aguirre is a 80 y.o. male who is submitted for pre-surgical anesthesia review and clearance prior to him undergoing the above procedure.Patient is a Former Smoker (quit 02/2004). Pertinent PMH includes: RBBB, asymptomatic PVCs, GERD (on daily H2 blocker), OA, RLS  Patient is followed by cardiology Fletcher Anon, MD). He was last seen in the cardiology clinic on 02/21/2020; notes reviewed.  At the time of his clinic visit, patient noted to be doing well overall from a cardiovascular perspective. He denied chest pain, shortness of breath, orthopnea, PND, palpitations, peripheral edema, vertiginous symptoms, and presyncope/syncope.  Patient reported being very active; exercise at the gym at least 3 times a week with no significant limitations. Functional capacity, as defined by DASI, is documented as being >/= 4 METS.  EKG performed in clinic revealed normal sinus rhythm with first-degree AV block and RBBB.  Cardiac event monitor study performed on 09/16/2017 revealed predominantly underlying normal sinus rhythm with up to  1500 documented PVCs in a 24-hour period.  Subsequent myocardial perfusion imaging study performed on 11/04/2017 revealed no evidence of significant myocardial ischemia or arrhythmia; LVEF 63% (see full interpretation of cardiovascular testing below).  There have been no significant changes to patient's health history.  No changes were made to his medication regimen.  Patient to follow-up with outpatient cardiology on an as-needed basis.  Patient scheduled for an elective hernia repair on 03/05/2020 with Dr. Hervey Ard. Given patient's past medical history significant for cardiovascular diagnoses, presurgical cardiac clearance was sought by the attending surgeons office. Per cardiology, "the patient has no signs suggestive of angina, heart failure, or arrhythmia.  He exercises regularly with no significant physical limitations.  Previous stress test in 2019 was normal. Based on this, the patient is at an overall LOW risk from a cardiovascular standpoint without the need for any further ischemic cardiac work-up".  This patient is not on any anticoagulation/antiplatelet therapies.  He denies previous perioperative complications with anesthesia personally, however notes that a first-degree relative (mother) had issues with delayed emergence. He underwent a general anesthetic course here (ASA III) in 08/2017 with no documented complications.   Vitals with BMI 02/21/2020 04/25/2019 04/20/2019  Height 6\' 0"  6\' 0"  6\' 0"   Weight 196 lbs 190 lbs 190 lbs  BMI 26.58 41.32 44.01  Systolic 027 253 -  Diastolic 98 664 -  Pulse 74 73 -    Providers/Specialists:   NOTE: Primary physician provider listed below. Patient may have been seen by APP or partner within same practice.   PROVIDER ROLE / SPECIALTY LAST OV  Bary Castilla Forest Gleason, MD General Surgery  02/19/2020  Tracie Harrier, MD Primary Care Provider  05/25/2019  Marlyne Beards, MD Cardiology  02/21/2020   Allergies:  Tramadol, Hydrocodone, and  Pollen extract  Current Home Medications:   No current facility-administered medications for this encounter.   Marland Kitchen acetaminophen (TYLENOL) 500 MG tablet  . b complex vitamins capsule  . Calcium-Magnesium-Zinc (CAL-MAG-ZINC PO)  . Cholecalciferol (VITAMIN D) 50 MCG (2000 UT) tablet  . cimetidine (TAGAMET) 200 MG tablet  . Cyanocobalamin (B-12) 5000 MCG CAPS  . famotidine (PEPCID) 20 MG tablet  . ferrous sulfate 325 (65 FE) MG tablet  . finasteride (PROSCAR) 5 MG tablet  . gabapentin (NEURONTIN) 100 MG capsule  . loratadine (CLARITIN) 10 MG tablet  . meloxicam (MOBIC) 7.5 MG tablet  . Multiple Vitamin (MULTIVITAMIN) capsule  . rOPINIRole (REQUIP) 0.5 MG tablet  . tamsulosin (FLOMAX) 0.4 MG CAPS capsule  . zolpidem (AMBIEN) 5 MG tablet   History:   Past Medical History:  Diagnosis Date  . Arthritis    shoulder, collar bone  . Asymptomatic PVCs    a. 09/2017 Holter: 1% PVC burden; b. 10/2017 MV: Ex time: 7:00. EF 63%, rare PVCs. No ischemia/infarct.  . Family history of adverse reaction to anesthesia    MOTHER SLOW TO WAKE UP  . GERD (gastroesophageal reflux disease)   . Prostate enlargement   . RBBB   . RLS (restless legs syndrome)   . Wears dentures    partial upper   Past Surgical History:  Procedure Laterality Date  . COLONOSCOPY  2015?   Dr Vira Agar  . HERNIA REPAIR Left 12/01/2005   Dr Bary Castilla.  inguinal  . INGUINAL HERNIA REPAIR Right 09/05/2017   Medium Ultra Pro mesh Surgeon: Robert Bellow, MD;  Location: [facility] ORS;  Service: General;  Laterality: Right;  . LIPOMA EXCISION  09/05/2017   Procedure: EXCISION LIPOMA;  Surgeon: Robert Bellow, MD;  Location: [facility] ORS;  Service: General;;  . NASAL TURBINATE REDUCTION Bilateral 01/10/2015   Procedure: [place] REDUCTION/SUBMUCOSAL ;  Surgeon: Beverly Gust, MD;  Location: [facility];  Service: ENT;  Laterality: Bilateral;  . SEPTOPLASTY N/A 01/10/2015   Procedure: SEPTOPLASTY;  Surgeon: Beverly Gust,  MD;  Location: [facility];  Service: ENT;  Laterality: N/A;   Family History  Problem Relation Age of Onset  . Aneurysm Father    Social History   Tobacco Use  . Smoking status: Former Smoker    Packs/day: 1.00    Types: Cigarettes    Quit date: 02/09/2004    Years since quitting: 16.0  . Smokeless tobacco: Never Used  . Tobacco comment: quit 2006  Vaping Use  . Vaping Use: Never used  Substance Use Topics  . Alcohol use: Yes    Alcohol/week: 7.0 standard drinks    Types: 7 Cans of beer per week  . Drug use: Never    Pertinent Clinical Results:  LABS: Labs reviewed: Acceptable for surgery.  Hospital Outpatient Visit on 03/03/2020  Component Date Value Ref Range Status  . WBC 03/03/2020 5.5  4.0 - 10.5 K/uL Final  . RBC 03/03/2020 5.03  4.22 - 5.81 MIL/uL Final  . Hemoglobin 03/03/2020 16.4  13.0 - 17.0 g/dL Final  . HCT 03/03/2020 46.9  39.0 - 52.0 % Final  . MCV 03/03/2020 93.2  80.0 - 100.0 fL Final  . MCH 03/03/2020 32.6  26.0 - 34.0 pg Final  . MCHC 03/03/2020 35.0  30.0 - 36.0 g/dL Final  . RDW 03/03/2020 12.9  11.5 - 15.5 % Final  . Platelets 03/03/2020 187  150 - 400 K/uL Final  .  nRBC 03/03/2020 0.0  0.0 - 0.2 % Final   Performed at [facility], [address] [place], [place] [address]  . Sodium 03/03/2020 141  135 - 145 mmol/L Final  . Potassium 03/03/2020 4.1  3.5 - 5.1 mmol/L Final  . Chloride 03/03/2020 104  98 - 111 mmol/L Final  . CO2 03/03/2020 27  22 - 32 mmol/L Final  . Glucose, Bld 03/03/2020 114* 70 - 99 mg/dL Final   Glucose reference range applies only to samples taken after fasting for at least 8 hours.  . BUN 03/03/2020 26* 8 - 23 mg/dL Final  . Creatinine, Ser 03/03/2020 1.19  0.61 - 1.24 mg/dL Final  . Calcium 03/03/2020 10.0  8.9 - 10.3 mg/dL Final  . GFR, Estimated 03/03/2020 >60  >60 mL/min Final   Comment: (NOTE) Calculated using the CKD-EPI Creatinine Equation (2021)   . Anion gap 03/03/2020 10  5 - 15 Final    Performed at [facility], [place], [place] [address]    ECG: Date: 02/21/2020 Time ECG obtained: 1342 PM Rate: 74 bpm Rhythm: SR with 1st degree AVB; RBBB Axis (leads I and aVF): Normal Intervals: PR 214 ms. QRS 136 ms. QTc 463 ms. ST segment and T wave changes: No evidence of acute ST segment elevation or depression Comparison: Similar to previous tracing obtained on 03/15/2019   IMAGING / PROCEDURES: LEXISCAN performed on 11/04/2017 1. LVEF 63% 2. Rare PVCs; rare couplets 3. Resting EKG with RBBB 4. Hypertensive with exercise 5. Target heart rate achieved; exercise time 7 minutes; achieved 7 METS 6. No evidence of significant stress-induced myocardial ischemia or arrhythmia 7. Low risk scan  24 HOUR CARDIAC EVENT MONITOR STUDY performed on 09/16/2017 1. Predominant normal sinus rhythm 2. Occasional PVCs with a total of 1500 beats in 24 hours representing 1% PVC burden  Impression and Plan:  Christopher Aguirre has been referred for pre-anesthesia review and clearance prior to him undergoing the planned anesthetic and procedural courses. Available labs, pertinent testing, and imaging results were personally reviewed by me. This patient has been appropriately cleared by cardiology with an overall LOW risk for significant perioperative cardiovascular complications.   Based on clinical review performed today (03/03/20), barring any significant acute changes in the patient's overall condition, it is anticipated that he will be able to proceed with the planned surgical intervention. Any acute changes in clinical condition may necessitate his procedure being postponed and/or cancelled. Pre-surgical instructions were reviewed with the patient during his PAT appointment and questions were fielded by PAT clinical staff.  Honor Loh, MSN, APRN, FNP-C, CEN [facility]  Peri-operative Services Nurse Practitioner Phone: [phone] 03/03/20 12:41  PM  NOTE: This note has been prepared using Dragon dictation software. Despite my best ability to proofread, there is always the potential that unintentional transcriptional errors may still occur from this process.

## 2020-02-27 NOTE — Patient Instructions (Addendum)
Your procedure is scheduled on:01/26/22436 [facility] Report to the Registration Desk on the 1st floor of the [facility]. To find out your arrival time, please call [phone] between 1PM - 3PM on: 03/04/20- TUESDAY  REMEMBER: Instructions that are not followed completely may result in serious medical risk, up to and including death; or upon the discretion of your surgeon and anesthesiologist your surgery may need to be rescheduled.  Do not eat food after midnight the night before surgery.  No gum chewing, lozengers or hard candies.  You may however, drink CLEAR liquids up to 2 hours before you are scheduled to arrive for your surgery. Do not drink anything within 2 hours of your scheduled arrival time.  Clear liquids include: - water  - apple juice without pulp - gatorade (not RED, PURPLE, OR BLUE) - black coffee or tea (Do NOT add milk or creamers to the coffee or tea) Do NOT drink anything that is not on this list.   TAKE THESE MEDICATIONS THE MORNING OF SURGERY WITH A SIP OF WATER: - cimetidine (TAGAMET) 200 MG tablet  - gabapentin (NEURONTIN) 100 MG capsule - loratadine (CLARITIN) 10 MG tablet - tamsulosin (FLOMAX) 0.4 MG CAPS capsule  One week prior to surgery: STOP TAKING 02/27/20 - meloxicam (MOBIC) 7.5 MG tablet Stop Anti-inflammatories (NSAIDS) such as Advil, Aleve, Ibuprofen, Motrin, Naproxen, Naprosyn and Aspirin based products such as Excedrin, Goodys Powder, BC Powder.  Stop ANY OVER THE COUNTER supplements STARTING 02/27/20 until after surgery - Calcium-Magnesium-Zinc (CAL-MAG-ZINC PO) (However, you may continue taking Vitamin D, Vitamin B, and multivitamin up until the day before surgery.)  No Alcohol for 24 hours before or after surgery.  No Smoking including e-cigarettes for 24 hours prior to surgery.  No chewable tobacco products for at least 6 hours prior to surgery.  No nicotine patches on the day of surgery.  Do not use any "recreational" drugs for at  least a week prior to your surgery.  Please be advised that the combination of cocaine and anesthesia may have negative outcomes, up to and including death. If you test positive for cocaine, your surgery will be cancelled.  On the morning of surgery brush your teeth with toothpaste and water, you may rinse your mouth with mouthwash if you wish. Do not swallow any toothpaste or mouthwash.  Do not wear jewelry, make-up, hairpins, clips or nail polish.  Do not wear lotions, powders, or perfumes.   Do not shave body from the neck down 48 hours prior to surgery just in case you cut yourself which could leave a site for infection.  Also, freshly shaved skin may become irritated if using the CHG soap.  Contact lenses, hearing aids and dentures may not be worn into surgery.  Do not bring valuables to the hospital. [facility] is not responsible for any missing/lost belongings or valuables.   Use CHG Soap or wipes as directed on instruction sheet.  Notify your doctor if there is any change in your medical condition (cold, fever, infection).  Wear comfortable clothing (specific to your surgery type) to the hospital.  Plan for stool softeners for home use; pain medications have a tendency to cause constipation. You can also help prevent constipation by eating foods high in fiber such as fruits and vegetables and drinking plenty of fluids as your diet allows.  After surgery, you can help prevent lung complications by doing breathing exercises.  Take deep breaths and cough every 1-2 hours. Your doctor may order a device  called an Chiropodist to help you take deep breaths. When coughing or sneezing, hold a pillow firmly against your incision with both hands. This is called "splinting." Doing this helps protect your incision. It also decreases belly discomfort.  If you are being admitted to the hospital overnight, leave your suitcase in the car. After surgery it may be brought to your  room.  If you are being discharged the day of surgery, you will not be allowed to drive home. You will need a responsible adult (18 years or older) to drive you home and stay with you that night.   If you are taking public transportation, you will need to have a responsible adult (18 years or older) with you. Please confirm with your physician that it is acceptable to use public transportation.   Please call the [place] Dept. at [phone] if you have any questions about these instructions.  Visitation Policy:  Patients undergoing a surgery or procedure may have one family member or support person with them as long as that person is not COVID-19 positive or experiencing its symptoms.  That person may remain in the waiting area during the procedure.  Inpatient Visitation:    Visiting hours are 7 a.m. to 8 p.m. Patients will be allowed one visitor. The visitor may change daily. The visitor must pass COVID-19 screenings, use hand sanitizer when entering and exiting the patient's room and wear a mask at all times, including in the patient's room. Patients must also wear a mask when staff or their visitor are in the room. Masking is required regardless of vaccination status. Systemwide, no visitors 17 or younger.

## 2020-03-03 ENCOUNTER — Other Ambulatory Visit: Payer: Self-pay

## 2020-03-03 ENCOUNTER — Encounter
Admission: RE | Admit: 2020-03-03 | Discharge: 2020-03-03 | Disposition: A | Discharge status: Home / Self Care | Payer: Medicare Other | Source: Ambulatory Visit | Attending: General Surgery | Admitting: General Surgery

## 2020-03-03 DIAGNOSIS — Z01812 Encounter for preprocedural laboratory examination: Secondary | ICD-10-CM | POA: Diagnosis not present

## 2020-03-03 DIAGNOSIS — Z20822 Contact with and (suspected) exposure to covid-19: Secondary | ICD-10-CM | POA: Insufficient documentation

## 2020-03-03 LAB — CBC
HCT: 46.9 % (ref 39.0–52.0)
Hemoglobin: 16.4 g/dL (ref 13.0–17.0)
MCH: 32.6 pg (ref 26.0–34.0)
MCHC: 35 g/dL (ref 30.0–36.0)
MCV: 93.2 fL (ref 80.0–100.0)
Platelets: 187 10*3/uL (ref 150–400)
RBC: 5.03 MIL/uL (ref 4.22–5.81)
RDW: 12.9 % (ref 11.5–15.5)
WBC: 5.5 10*3/uL (ref 4.0–10.5)
nRBC: 0 % (ref 0.0–0.2)

## 2020-03-03 LAB — BASIC METABOLIC PANEL
Anion gap: 10 (ref 5–15)
BUN: 26 mg/dL — ABNORMAL HIGH (ref 8–23)
CO2: 27 mmol/L (ref 22–32)
Calcium: 10 mg/dL (ref 8.9–10.3)
Chloride: 104 mmol/L (ref 98–111)
Creatinine, Ser: 1.19 mg/dL (ref 0.61–1.24)
GFR, Estimated: >60 mL/min (ref >60)
Glucose, Bld: 114 mg/dL — ABNORMAL HIGH (ref 70–99)
Potassium: 4.1 mmol/L (ref 3.5–5.1)
Sodium: 141 mmol/L (ref 135–145)

## 2020-03-03 LAB — SARS CORONAVIRUS 2 (TAT 6-24 HRS): SARS Coronavirus 2: NEGATIVE

## 2020-03-05 ENCOUNTER — Ambulatory Visit: Payer: Medicare Other | Admitting: Urgent Care

## 2020-03-05 ENCOUNTER — Ambulatory Visit
Admission: RE | Admit: 2020-03-05 | Discharge: 2020-03-05 | Disposition: A | Discharge status: Home / Self Care | Payer: Medicare Other | Attending: General Surgery | Admitting: General Surgery

## 2020-03-05 ENCOUNTER — Encounter
Admission: RE | Disposition: A | Discharge status: Home / Self Care | Payer: Self-pay | Source: Home / Self Care | Attending: General Surgery

## 2020-03-05 ENCOUNTER — Encounter: Payer: Self-pay | Admitting: General Surgery

## 2020-03-05 ENCOUNTER — Other Ambulatory Visit: Payer: Self-pay

## 2020-03-05 DIAGNOSIS — Z87891 Personal history of nicotine dependence: Secondary | ICD-10-CM | POA: Insufficient documentation

## 2020-03-05 DIAGNOSIS — Z791 Long term (current) use of non-steroidal anti-inflammatories (NSAID): Secondary | ICD-10-CM | POA: Diagnosis not present

## 2020-03-05 DIAGNOSIS — Z79899 Other long term (current) drug therapy: Secondary | ICD-10-CM | POA: Diagnosis not present

## 2020-03-05 DIAGNOSIS — Z885 Allergy status to narcotic agent status: Secondary | ICD-10-CM | POA: Diagnosis not present

## 2020-03-05 DIAGNOSIS — K4091 Unilateral inguinal hernia, without obstruction or gangrene, recurrent: Secondary | ICD-10-CM | POA: Insufficient documentation

## 2020-03-05 HISTORY — PX: INGUINAL HERNIA REPAIR: SHX194

## 2020-03-05 SURGERY — REPAIR, HERNIA, INGUINAL, ADULT
Anesthesia: General | Laterality: Right

## 2020-03-05 MED ORDER — BUPIVACAINE-EPINEPHRINE (PF) 0.5% -1:200000 IJ SOLN
INTRAMUSCULAR | Status: DC | PRN
  Administered 2020-03-05: 20 mL
  Administered 2020-03-05: 10 mL

## 2020-03-05 MED ORDER — EPINEPHRINE PF 1 MG/ML IJ SOLN
INTRAMUSCULAR | Status: AC
  Filled 2020-03-05: qty 1

## 2020-03-05 MED ORDER — DEXAMETHASONE SODIUM PHOSPHATE 10 MG/ML IJ SOLN
INTRAMUSCULAR | Status: DC | PRN
  Administered 2020-03-05: 10 mg via INTRAVENOUS

## 2020-03-05 MED ORDER — PHENYLEPHRINE HCL (PRESSORS) 10 MG/ML IV SOLN
INTRAVENOUS | Status: DC | PRN
  Administered 2020-03-05 (×3): 100 ug via INTRAVENOUS
  Administered 2020-03-05: 80 ug via INTRAVENOUS
  Administered 2020-03-05: 200 ug via INTRAVENOUS

## 2020-03-05 MED ORDER — PROPOFOL 10 MG/ML IV BOLUS
INTRAVENOUS | Status: DC | PRN
  Administered 2020-03-05: 20 mg via INTRAVENOUS
  Administered 2020-03-05: 160 mg via INTRAVENOUS
  Administered 2020-03-05: 20 mg via INTRAVENOUS

## 2020-03-05 MED ORDER — EPHEDRINE SULFATE 50 MG/ML IJ SOLN
INTRAMUSCULAR | Status: DC | PRN
  Administered 2020-03-05: 10 mg via INTRAVENOUS

## 2020-03-05 MED ORDER — LIDOCAINE HCL URETHRAL/MUCOSAL 2 % EX GEL
CUTANEOUS | Status: AC
  Filled 2020-03-05: qty 5

## 2020-03-05 MED ORDER — ONDANSETRON HCL 4 MG/2ML IJ SOLN: 4.0000 mg | Freq: Once | INTRAMUSCULAR | Status: DC | PRN

## 2020-03-05 MED ORDER — ONDANSETRON 4 MG PO TBDP: 4.0000 mg | ORAL_TABLET | ORAL | 0 refills | Status: DC | PRN

## 2020-03-05 MED ORDER — FENTANYL CITRATE (PF) 100 MCG/2ML IJ SOLN
INTRAMUSCULAR | Status: AC
  Filled 2020-03-05: qty 2

## 2020-03-05 MED ORDER — CEFAZOLIN SODIUM-DEXTROSE 2-4 GM/100ML-% IV SOLN
2.0000 g | INTRAVENOUS | Status: AC
  Administered 2020-03-05: 2 g via INTRAVENOUS

## 2020-03-05 MED ORDER — FENTANYL CITRATE (PF) 100 MCG/2ML IJ SOLN: 25.0000 ug | INTRAMUSCULAR | Status: DC | PRN

## 2020-03-05 MED ORDER — ACETAMINOPHEN 10 MG/ML IV SOLN
INTRAVENOUS | Status: DC | PRN
  Administered 2020-03-05: 1000 mg via INTRAVENOUS

## 2020-03-05 MED ORDER — CHLORHEXIDINE GLUCONATE 0.12 % MT SOLN
OROMUCOSAL | Status: AC
  Administered 2020-03-05: 15 mL via OROMUCOSAL
  Filled 2020-03-05: qty 15

## 2020-03-05 MED ORDER — CEFAZOLIN SODIUM-DEXTROSE 2-4 GM/100ML-% IV SOLN
INTRAVENOUS | Status: AC
  Filled 2020-03-05: qty 100

## 2020-03-05 MED ORDER — ACETAMINOPHEN 10 MG/ML IV SOLN
INTRAVENOUS | Status: AC
  Filled 2020-03-05: qty 100

## 2020-03-05 MED ORDER — LACTATED RINGERS IV SOLN: INTRAVENOUS | Status: DC

## 2020-03-05 MED ORDER — CHLORHEXIDINE GLUCONATE 0.12 % MT SOLN: 15.0000 mL | Freq: Once | OROMUCOSAL | Status: AC

## 2020-03-05 MED ORDER — CHLORHEXIDINE GLUCONATE CLOTH 2 % EX PADS: 6.0000 | MEDICATED_PAD | Freq: Once | CUTANEOUS | Status: DC

## 2020-03-05 MED ORDER — PROPOFOL 10 MG/ML IV BOLUS
INTRAVENOUS | Status: AC
  Filled 2020-03-05: qty 40

## 2020-03-05 MED ORDER — LIDOCAINE HCL (CARDIAC) PF 100 MG/5ML IV SOSY
PREFILLED_SYRINGE | INTRAVENOUS | Status: DC | PRN
  Administered 2020-03-05: 100 mg via INTRAVENOUS

## 2020-03-05 MED ORDER — VASOPRESSIN 20 UNIT/ML IV SOLN
INTRAVENOUS | Status: DC | PRN
  Administered 2020-03-05: 2 [IU] via INTRAVENOUS

## 2020-03-05 MED ORDER — FENTANYL CITRATE (PF) 100 MCG/2ML IJ SOLN
INTRAMUSCULAR | Status: DC | PRN
  Administered 2020-03-05 (×3): 25 ug via INTRAVENOUS

## 2020-03-05 MED ORDER — ONDANSETRON HCL 4 MG/2ML IJ SOLN
INTRAMUSCULAR | Status: DC | PRN
  Administered 2020-03-05: 4 mg via INTRAVENOUS

## 2020-03-05 MED ORDER — BUPIVACAINE HCL (PF) 0.5 % IJ SOLN
INTRAMUSCULAR | Status: AC
  Filled 2020-03-05: qty 30

## 2020-03-05 MED ORDER — ORAL CARE MOUTH RINSE: 15.0000 mL | Freq: Once | OROMUCOSAL | Status: AC

## 2020-03-05 MED ORDER — KETOROLAC TROMETHAMINE 30 MG/ML IJ SOLN
INTRAMUSCULAR | Status: AC
  Filled 2020-03-05: qty 1

## 2020-03-05 MED ORDER — LIDOCAINE HCL (PF) 2 % IJ SOLN
INTRAMUSCULAR | Status: AC
  Filled 2020-03-05: qty 5

## 2020-03-05 SURGICAL SUPPLY — 35 items
APL PRP STRL LF DISP 70% ISPRP (MISCELLANEOUS) ×1
BLADE SURG 15 STRL SS SAFETY (BLADE) ×2 IMPLANT
CANISTER SUCT 1200ML W/VALVE (MISCELLANEOUS) ×2 IMPLANT
CHLORAPREP W/TINT 26 (MISCELLANEOUS) ×2 IMPLANT
COVER WAND RF STERILE (DRAPES) ×2 IMPLANT
DECANTER SPIKE VIAL GLASS SM (MISCELLANEOUS) ×2 IMPLANT
DRAIN PENROSE 1/4X12 LTX STRL (WOUND CARE) ×2 IMPLANT
DRAPE LAPAROTOMY 100X77 ABD (DRAPES) ×2 IMPLANT
DRSG TEGADERM 4X4.75 (GAUZE/BANDAGES/DRESSINGS) ×2 IMPLANT
DRSG TELFA 4X3 1S NADH ST (GAUZE/BANDAGES/DRESSINGS) ×2 IMPLANT
ELECT REM PT RETURN 9FT ADLT (ELECTROSURGICAL) ×2
ELECTRODE REM PT RTRN 9FT ADLT (ELECTROSURGICAL) ×1 IMPLANT
GLOVE BIO SURGEON STRL SZ7.5 (GLOVE) ×6 IMPLANT
GLOVE INDICATOR 8.0 STRL GRN (GLOVE) ×6 IMPLANT
GOWN STRL REUS W/ TWL LRG LVL3 (GOWN DISPOSABLE) ×3 IMPLANT
GOWN STRL REUS W/TWL LRG LVL3 (GOWN DISPOSABLE) ×6
KIT TURNOVER KIT A (KITS) ×2 IMPLANT
LABEL OR SOLS (LABEL) ×2 IMPLANT
MANIFOLD NEPTUNE II (INSTRUMENTS) ×2 IMPLANT
MESH MARLEX PLUG MEDIUM (Mesh General) ×2 IMPLANT
NEEDLE HYPO 22GX1.5 SAFETY (NEEDLE) ×4 IMPLANT
PACK BASIN MINOR ARMC (MISCELLANEOUS) ×2 IMPLANT
STRIP CLOSURE SKIN 1/2X4 (GAUZE/BANDAGES/DRESSINGS) ×2 IMPLANT
SUT PDS AB 0 CT1 27 (SUTURE) ×2 IMPLANT
SUT SURGILON 0 BLK (SUTURE) ×4 IMPLANT
SUT VIC AB 2-0 SH 27 (SUTURE) ×2
SUT VIC AB 2-0 SH 27XBRD (SUTURE) ×1 IMPLANT
SUT VIC AB 3-0 54X BRD REEL (SUTURE) ×1 IMPLANT
SUT VIC AB 3-0 BRD 54 (SUTURE) ×2
SUT VIC AB 3-0 SH 27 (SUTURE) ×2
SUT VIC AB 3-0 SH 27X BRD (SUTURE) ×1 IMPLANT
SUT VIC AB 4-0 FS2 27 (SUTURE) ×2 IMPLANT
SWABSTK COMLB BENZOIN TINCTURE (MISCELLANEOUS) ×2 IMPLANT
SYR 10ML LL (SYRINGE) ×2 IMPLANT
SYR 3ML LL SCALE MARK (SYRINGE) ×2 IMPLANT

## 2020-03-05 NOTE — Op Note (Signed)
Preoperative diagnosis: Recurrent right inguinal hernia.  Postoperative diagnosis: Same, indirect.  Operative procedure: Recurrent right inguinal hernia repair with PerFix medium plug.  Operating surgeon: Hervey Ard, MD.  Anesthesia: General by LMA, Marcaine 0.5% with 1: 200,000 units of epinephrine, 30 cc, Toradol: 30 mg.  Estimated blood loss: 5 cc.  Clinical note: This 80 year old male is 2-1/2 years out from repair of right inguinal hernia with a ultra pro mesh.  He developed a recurrence about a year ago.  Surgery was postponed due to shingles.  He is admitted now for elective repair.  The patient had hair removed from the surgical site prior to presentation the operating theater.  He received Ancef 2 g intravenously.  SCD stockings for DVT prevention.  Operative note: The patient underwent general anesthesia and tolerated this well.  The abdomen was cleansed with ChloraPrep and draped.  The prior incision in the right groin was opened after field block anesthesia was established.  The skin was incised sharply and remaining dissection completed with electrocautery.  The prior layers were identified and the external oblique fascia opened.  There was really fairly minimal scarring.  The cord structures were freed.  The iliohypogastric nerve was identified.  The ilioinguinal nerve was not seen.  I had anticipated failure at the medial aspect of the inguinal canal but this was intact.  The patient was found to have a recurrent indirect hernia adjacent to the mesh and medial superior to the cord.  This area was freed circumferentially and a medium Bard PerFix plug placed.  This was anchored into position with 3-0 Surgilon sutures.  The remaining mesh was intact and the reinforcing piece of the floor the canal was not placed.  Toradol was placed in the wound.  The external oblique was closed with a running 2-0 Vicryl suture.  Scarpa's fascia was closed with a running 3-0 Vicryl suture.  The  subcutaneous fat was approximated with a running 3-0 Vicryl suture.  The skin was closed with a running 4-0 Vicryl subcuticular suture.  Benzoin, Steri-Strips, Telfa and Tegaderm dressing was applied.  The patient tolerated the procedure well and was taken to recovery room in stable condition.

## 2020-03-05 NOTE — Discharge Instructions (Signed)
AMBULATORY SURGERY  DISCHARGE INSTRUCTIONS   1) The drugs that you were given will stay in your system until tomorrow so for the next 24 hours you should not:  A) Drive an automobile B) Make any legal decisions C) Drink any alcoholic beverage   2) You may resume regular meals tomorrow.  Today it is better to start with liquids and gradually work up to solid foods.  You may eat anything you prefer, but it is better to start with liquids, then soup and crackers, and gradually work up to solid foods.   3) Please notify your doctor immediately if you have any unusual bleeding, trouble breathing, redness and pain at the surgery site, drainage, fever, or pain not relieved by medication.  4) Your post-operative visit with Dr.                                     is: Date:                        Time:    Please call to schedule your post-operative visit.  5) Additional Instructions: Inguinal Hernia, Adult An inguinal hernia develops when fat or the intestines push through a weak spot in a muscle where the leg meets the lower abdomen (groin). This creates a bulge. This kind of hernia could also be:  In the scrotum, if you are male.  In folds of skin around the vagina, if you are male. There are three types of inguinal hernias:  Hernias that can be pushed back into the abdomen (are reducible). This type rarely causes pain.  Hernias that are not reducible (are incarcerated).  Hernias that are not reducible and lose their blood supply (are strangulated). This type of hernia requires emergency surgery. What are the causes? This condition is caused by having a weak spot in the muscles or tissues in your groin. This develops over time. The hernia may poke through the weak spot when you suddenly strain your lower abdominal muscles, such as when you:  Lift a heavy object.  Strain to have a bowel movement. Constipation can lead to straining.  Cough. What increases the risk? This  condition is more likely to develop in:  Males.  Pregnant females.  People who: ? Are overweight. ? Work in jobs that require long periods of standing or heavy lifting. ? Have had an inguinal hernia before. ? Smoke or have lung disease. These factors can lead to long-term (chronic) coughing. What are the signs or symptoms? Symptoms may depend on the size of the hernia. Often, a small inguinal hernia has no symptoms. Symptoms of a larger hernia may include:  A bulge in the groin area. This is easier to see when standing. It might not be visible when lying down.  Pain or burning in the groin. This may get worse when lifting, straining, or coughing.  A dull ache or a feeling of pressure in the groin.  An unusual bulge in the scrotum, in males. Symptoms of a strangulated inguinal hernia may include:  A bulge in your groin that is very painful and tender to the touch.  A bulge that turns red or purple.  Fever, nausea, and vomiting.  Inability to have a bowel movement or to pass gas. How is this diagnosed? This condition is diagnosed based on your symptoms, your medical history, and a physical exam. Your health  care provider may feel your groin area and ask you to cough. How is this treated? Treatment depends on the size of your hernia and whether you have symptoms. If you do not have symptoms, your health care provider may have you watch your hernia carefully and have you come in for follow-up visits. If your hernia is large or if you have symptoms, you may need surgery to repair the hernia. Follow these instructions at home: Lifestyle  Avoid lifting heavy objects.  Avoid standing for long periods of time.  Do not use any products that contain nicotine or tobacco. These products include cigarettes, chewing tobacco, and vaping devices, such as e-cigarettes. If you need help quitting, ask your health care provider.  Maintain a healthy weight. Preventing constipation You may need  to take these actions to prevent or treat constipation:  Drink enough fluid to keep your urine pale yellow.  Take over-the-counter or prescription medicines.  Eat foods that are high in fiber, such as beans, whole grains, and fresh fruits and vegetables.  Limit foods that are high in fat and processed sugars, such as fried or sweet foods. General instructions  You may try to push the hernia back in place by very gently pressing on it while lying down. Do not try to force the bulge back in if it will not push in easily.  Watch your hernia for any changes in shape, size, or color. Get help right away if you notice any changes.  Take over-the-counter and prescription medicines only as told by your health care provider.  Keep all follow-up visits. This is important. Contact a health care provider if:  You have a fever or chills.  You develop new symptoms.  Your symptoms get worse. Get help right away if:  You have pain in your groin that suddenly gets worse.  You have a bulge in your groin that: ? Suddenly gets bigger and does not get smaller. ? Becomes red or purple or painful to the touch.  You are a man and you have a sudden pain in your scrotum, or the size of your scrotum suddenly changes.  You cannot push the hernia back in place by very gently pressing on it when you are lying down.  You have nausea or vomiting that does not go away.  You have a fast heartbeat.  You cannot have a bowel movement or pass gas. These symptoms may represent a serious problem that is an emergency. Do not wait to see if the symptoms will go away. Get medical help right away. Call your local emergency services (911 in the [place] Summary  An inguinal hernia develops when fat or the intestines push through a weak spot in a muscle where your leg meets your lower abdomen (groin).  This condition is caused by having a weak spot in muscles or tissues in your groin.  Symptoms may depend on the  size of the hernia, and they may include pain or swelling in your groin. A small inguinal hernia often has no symptoms.  Treatment may not be needed if you do not have symptoms. If you have symptoms or a large hernia, you may need surgery to repair the hernia.  Avoid lifting heavy objects. Also, avoid standing for long periods of time. This information is not intended to replace advice given to you by your health care provider. Make sure you discuss any questions you have with your health care provider. Document Revised: 09/25/2019 Document Reviewed: 09/25/2019 Elsevier Patient Education  [place].

## 2020-03-05 NOTE — Anesthesia Preprocedure Evaluation (Signed)
Anesthesia Evaluation  Patient identified by MRN, date of birth, ID band Patient awake    Reviewed: Allergy & Precautions, NPO status , Patient's Chart, lab work & pertinent test results, reviewed documented beta blocker date and time   History of Anesthesia Complications Negative for: history of anesthetic complications  Airway Mallampati: II  TM Distance: >3 FB     Dental  (+) Chipped, Partial Upper, Dental Advidsory Given   Pulmonary neg pulmonary ROS, former smoker,           Cardiovascular Exercise Tolerance: Good (-) hypertension(-) angina(-) Past MI and (-) Cardiac Stents + dysrhythmias (PVCs and RBBB) (-) Valvular Problems/Murmurs     Neuro/Psych negative neurological ROS  negative psych ROS   GI/Hepatic Neg liver ROS, GERD  Controlled,  Endo/Other  negative endocrine ROS  Renal/GU negative Renal ROS     Musculoskeletal  (+) Arthritis ,   Abdominal   Peds  Hematology   Anesthesia Other Findings Past Medical History: No date: Arthritis     Comment:  shoulder, collar bone No date: Asymptomatic PVCs     Comment:  a. 09/2017 Holter: 1% PVC burden; b. 10/2017 MV: Ex time:               7:00. EF 63%, rare PVCs. No ischemia/infarct. No date: Family history of adverse reaction to anesthesia     Comment:  MOTHER SLOW TO WAKE UP No date: GERD (gastroesophageal reflux disease) No date: Prostate enlargement No date: RBBB No date: RLS (restless legs syndrome) No date: Wears dentures     Comment:  partial upper   Reproductive/Obstetrics                             Anesthesia Physical  Anesthesia Plan  ASA: III  Anesthesia Plan: General   Post-op Pain Management:    Induction: Intravenous  PONV Risk Score and Plan: 2 and Ondansetron, Dexamethasone and Treatment may vary due to age or medical condition  Airway Management Planned: LMA  Additional Equipment:   Intra-op Plan:    Post-operative Plan: Extubation in OR  Informed Consent: I have reviewed the patients History and Physical, chart, labs and discussed the procedure including the risks, benefits and alternatives for the proposed anesthesia with the patient or authorized representative who has indicated his/her understanding and acceptance.       Plan Discussed with: CRNA  Anesthesia Plan Comments:         Anesthesia Quick Evaluation

## 2020-03-05 NOTE — Transfer of Care (Signed)
Immediate Anesthesia Transfer of Care Note  Patient: Christopher Aguirre  Procedure(s) Performed: HERNIA REPAIR WITH MESH INGUINAL ADULT (Right )  Patient Location: PACU  Anesthesia Type:General  Level of Consciousness: awake, alert  and oriented  Airway & Oxygen Therapy: Patient Spontanous Breathing and Patient connected to face mask oxygen  Post-op Assessment: Report given to RN and Post -op Vital signs reviewed and stable  Post vital signs: Reviewed and stable  Last Vitals:  Vitals Value Taken Time  BP 121/83 03/05/20 1500  Temp 36.5 C 03/05/20 1500  Pulse 91 03/05/20 1509  Resp 11 03/05/20 1509  SpO2 98 % 03/05/20 1509  Vitals shown include unvalidated device data.  Last Pain:  Vitals:   03/05/20 1500  TempSrc:   PainSc: Asleep         Complications: No complications documented.

## 2020-03-05 NOTE — H&P (Signed)
Christopher Aguirre [id number] 1940/07/17     HPI:  Active 80 y/o male 2.5 years s/p right inguinal hernia repair with Ultrapro mesh. He has developed a symptomatic medial recurrence. For repair.   Medications Prior to Admission  Medication Sig Dispense Refill Last Dose  . acetaminophen (TYLENOL) 500 MG tablet Take 1,000 mg by mouth daily as needed for moderate pain or headache.   Past Month at Unknown time  . b complex vitamins capsule Take 1 capsule by mouth daily.   03/03/2020  . Calcium-Magnesium-Zinc (CAL-MAG-ZINC PO) Take 2 tablets by mouth daily.   03/03/2020  . Cholecalciferol (VITAMIN D) 50 MCG (2000 UT) tablet Take 2,000 Units by mouth daily.   03/03/2020  . cimetidine (TAGAMET) 200 MG tablet Take 200 mg by mouth daily.   03/05/2020 at 0930  . Cyanocobalamin (B-12) 5000 MCG CAPS Take 5,000 mcg by mouth daily.   03/03/2020  . famotidine (PEPCID) 20 MG tablet Take 20 mg by mouth at bedtime.   03/04/2020 at Unknown time  . ferrous sulfate 325 (65 FE) MG tablet Take 325 mg by mouth daily.   03/02/2020  . finasteride (PROSCAR) 5 MG tablet Take 5 mg by mouth at bedtime.   1 03/04/2020 at Unknown time  . gabapentin (NEURONTIN) 100 MG capsule Take 1 capsule (100 mg total) by mouth 3 (three) times daily. (Patient taking differently: Take 100 mg by mouth 2 (two) times daily.) 1 capsule 0 03/05/2020 at 0930  . loratadine (CLARITIN) 10 MG tablet Take 10 mg by mouth daily.   03/04/2020 at Unknown time  . meloxicam (MOBIC) 7.5 MG tablet Take 7.5 mg by mouth daily.   02/27/2020  . Multiple Vitamin (MULTIVITAMIN) capsule Take 1 capsule by mouth daily.    03/04/2020 at Unknown time  . rOPINIRole (REQUIP) 0.5 MG tablet Take 0.5 mg by mouth at bedtime as needed (for restless leg syndrome).   02/20/2020  . tamsulosin (FLOMAX) 0.4 MG CAPS capsule Take 0.8 mg by mouth daily.    03/05/2020 at 0930  . zolpidem (AMBIEN) 5 MG tablet Take 2.5 mg by mouth at bedtime as needed for sleep.   03/04/2020 at Unknown time   Allergies   Allergen Reactions  . Tramadol   . Hydrocodone Other (See Comments)    Makes patient feel terrible and too woozie  . Pollen Extract     Respiratory symptoms   Past Medical History:  Diagnosis Date  . Arthritis    shoulder, collar bone  . Asymptomatic PVCs    a. 09/2017 Holter: 1% PVC burden; b. 10/2017 MV: Ex time: 7:00. EF 63%, rare PVCs. No ischemia/infarct.  . Family history of adverse reaction to anesthesia    MOTHER SLOW TO WAKE UP  . GERD (gastroesophageal reflux disease)   . Prostate enlargement   . RBBB   . RLS (restless legs syndrome)   . Wears dentures    partial upper   Past Surgical History:  Procedure Laterality Date  . COLONOSCOPY  2015?   Dr Vira Agar  . HERNIA REPAIR Left 12/01/2005   Dr Bary Castilla.  inguinal  . INGUINAL HERNIA REPAIR Right 09/05/2017   Medium Ultra Pro mesh Surgeon: Robert Bellow, MD;  Location: [facility];  Service: General;  Laterality: Right;  . LIPOMA EXCISION  09/05/2017   Procedure: EXCISION LIPOMA;  Surgeon: Robert Bellow, MD;  Location: [facility];  Service: General;;  . NASAL TURBINATE REDUCTION Bilateral 01/10/2015   Procedure: [place] REDUCTION/SUBMUCOSAL ;  Surgeon: Beverly Gust,  MD;  Location: [place];  Service: ENT;  Laterality: Bilateral;  . SEPTOPLASTY N/A 01/10/2015   Procedure: SEPTOPLASTY;  Surgeon: Beverly Gust, MD;  Location: [place];  Service: ENT;  Laterality: N/A;   Social History   Socioeconomic History  . Marital status: Married    Spouse name: Not on file  . Number of children: Not on file  . Years of education: Not on file  . Highest education level: Not on file  Occupational History  . Not on file  Tobacco Use  . Smoking status: Former Smoker    Packs/day: 1.00    Types: Cigarettes    Quit date: 02/09/2004    Years since quitting: 16.0  . Smokeless tobacco: Never Used  . Tobacco comment: quit 2006  Vaping Use  . Vaping Use: Never used  Substance and Sexual Activity  .  Alcohol use: Yes    Alcohol/week: 7.0 standard drinks    Types: 7 Cans of beer per week  . Drug use: Never  . Sexual activity: Not on file  Other Topics Concern  . Not on file  Social History Narrative  . Not on file   Social Determinants of Health   Financial Resource Strain: Not on file  Food Insecurity: Not on file  Transportation Needs: Not on file  Physical Activity: Not on file  Stress: Not on file  Social Connections: Not on file  Intimate Partner Violence: Not on file   Social History   Social History Narrative  . Not on file     ROS: Negative.     PE: HEENT: Negative. Lungs: Clear. Cardio: RR.   Assessment/Plan:  Proceed with planned mesh repair of recurrent right inguinal hernia.   Forest Gleason [facility] 03/05/2020

## 2020-03-05 NOTE — Anesthesia Procedure Notes (Signed)
Procedure Name: LMA Insertion Date/Time: 03/05/2020 2:00 PM Performed by: Allean Found, CRNA Pre-anesthesia Checklist: Patient identified, Patient being monitored, Timeout performed, Emergency Drugs available and Suction available Patient Re-evaluated:Patient Re-evaluated prior to induction Oxygen Delivery Method: Circle system utilized Preoxygenation: Pre-oxygenation with 100% oxygen Induction Type: IV induction Ventilation: Mask ventilation without difficulty LMA: LMA inserted LMA Size: 5.0 Tube type: Oral Number of attempts: 1 Placement Confirmation: positive ETCO2 and breath sounds checked- equal and bilateral Tube secured with: Tape Dental Injury: Teeth and Oropharynx as per pre-operative assessment

## 2020-03-06 ENCOUNTER — Encounter: Payer: Self-pay | Admitting: General Surgery

## 2020-03-06 NOTE — Anesthesia Postprocedure Evaluation (Signed)
Anesthesia Post Note  Patient: ADAIR LAUDERBACK  Procedure(s) Performed: HERNIA REPAIR WITH MESH INGUINAL ADULT (Right )  Patient location during evaluation: PACU Anesthesia Type: General Level of consciousness: awake and alert Pain management: pain level controlled Vital Signs Assessment: post-procedure vital signs reviewed and stable Respiratory status: spontaneous breathing, nonlabored ventilation, respiratory function stable and patient connected to nasal cannula oxygen Cardiovascular status: blood pressure returned to baseline and stable Postop Assessment: no apparent nausea or vomiting Anesthetic complications: no   No complications documented.   Last Vitals:  Vitals:   03/05/20 1530 03/05/20 1540  BP: 113/74 133/82  Pulse: 83 81  Resp: (!) 7 16  Temp: 36.5 C (!) 36.4 C  SpO2: 93% 94%    Last Pain:  Vitals:   03/05/20 1540  TempSrc: Oral  PainSc: 0-No pain                 Precious Haws Antuan Limes

## 2021-12-28 ENCOUNTER — Ambulatory Visit (INDEPENDENT_AMBULATORY_CARE_PROVIDER_SITE_OTHER): Payer: Medicare Other | Admitting: Podiatry

## 2021-12-28 ENCOUNTER — Encounter: Payer: Self-pay | Admitting: Podiatry

## 2021-12-28 DIAGNOSIS — M7752 Other enthesopathy of left foot: Secondary | ICD-10-CM | POA: Diagnosis not present

## 2021-12-28 DIAGNOSIS — D2372 Other benign neoplasm of skin of left lower limb, including hip: Secondary | ICD-10-CM | POA: Diagnosis not present

## 2021-12-28 MED ORDER — DEXAMETHASONE SODIUM PHOSPHATE 120 MG/30ML IJ SOLN
2.0000 mg | Freq: Once | INTRAMUSCULAR | Status: AC
  Administered 2021-12-28: 2 mg via INTRA_ARTICULAR

## 2021-12-28 NOTE — Progress Notes (Signed)
He presents today after having not seen him for about 3 years chief concern of same painful lesion plantar aspect of the left foot.  Objective: Vital signs are stable alert oriented x3.  Pulses are palpable.  He has fluctuance on palpation beneath the fifth metatarsal head of the left foot.  There is an overlying benign skin lesion as well.  This does not appear to be verrucoid in nature.  Assessment: Benign skin lesion overlying plantar aspect of the fifth metatarsal head with subcutaneous bursitis.  Plan: I injected the bursa with 2 mg of dexamethasone local anesthetic I also enucleated the lesion today for him and I will follow-up with him on an as-needed basis.

## 2022-09-20 ENCOUNTER — Ambulatory Visit: Payer: Medicare Other | Admitting: Podiatry

## 2022-09-29 ENCOUNTER — Ambulatory Visit: Payer: Medicare Other | Admitting: Podiatry

## 2022-09-29 ENCOUNTER — Encounter: Payer: Self-pay | Admitting: Podiatry

## 2022-09-29 DIAGNOSIS — M722 Plantar fascial fibromatosis: Secondary | ICD-10-CM | POA: Diagnosis not present

## 2022-09-30 NOTE — Progress Notes (Signed)
He presents today concerned about a small nodule on the distal medial plantar foot he states that initially it was very painful but now has subsided considerably.  I really just want to know what it is to make sure that is not cancerous.  Objective: Vital signs are stable alert and oriented x 3 reviewed his past medical history medications allergies surgeries and social history.  Pulses are strong palpable neurologic sensorium is intact deep tendon reflexes are intact muscle strength is normal and symmetrical.  Palpation of the right foot distal medial band of the plantar fascia does demonstrate a less than 1.0 cm nonpulsatile firm mass consistent with a plantar fibroma.  Assessment: Plantar fibroma.  Plan: Instructed him to follow-up with [place] should this become more painful or should he develop more lesions.

## 2023-03-25 ENCOUNTER — Encounter: Payer: Medicare Other | Admitting: Urology

## 2023-04-08 ENCOUNTER — Encounter: Payer: Self-pay | Admitting: Urology

## 2023-04-08 ENCOUNTER — Ambulatory Visit: Payer: Medicare Other | Admitting: Urology

## 2023-04-08 VITALS — BP 138/74 | HR 78 | Ht 72.0 in | Wt 159.0 lb

## 2023-04-08 DIAGNOSIS — R351 Nocturia: Secondary | ICD-10-CM | POA: Diagnosis not present

## 2023-04-08 DIAGNOSIS — N401 Enlarged prostate with lower urinary tract symptoms: Secondary | ICD-10-CM

## 2023-04-08 MED ORDER — TROSPIUM CHLORIDE 20 MG PO TABS: 20.0000 mg | ORAL_TABLET | Freq: Two times a day (BID) | ORAL | 0 refills | Status: DC

## 2023-04-08 NOTE — Progress Notes (Signed)
 I, Christopher Aguirre, acting as a scribe for Christopher Altes, MD., have documented all relevant documentation on the behalf of Christopher Altes, MD, as directed by Christopher Altes, MD while in the presence of Christopher Altes, MD.  04/08/2023 9:09 PM   Christopher Aguirre [birth year] [id number]  Referring provider: Barbette Reichmann, MD [address] [facility] [place],  [place] [address]  Chief Complaint  Patient presents with   Benign Prostatic Hypertrophy    HPI: Christopher Aguirre is a 83 y.o. male presents to re-establish urologic care.   I last saw him in 2014, prior to going to [facility]. He states he did not establish care with another urologist, and his PCP has been refilling his finasteride and tamsulosin. His PSA has been stable. The main reason for his visit today was to see if his medications were optimized with his only complaint of nocturia Q2 hours, which does interrupt his sleep pattern  He is currently taking tamsulosin 0.8 mg daily and finasteride 5 mg daily.  Reports snoring and likely sleep apnea; wife is a retired Scientist, clinical (histocompatibility and immunogenetics).   PMH: Past Medical History:  Diagnosis Date   Arthritis    shoulder, collar bone   Asymptomatic PVCs    a. 09/2017 Holter: 1% PVC burden; b. 10/2017 MV: Ex time: 7:00. EF 63%, rare PVCs. No ischemia/infarct.   Family history of adverse reaction to anesthesia    MOTHER SLOW TO WAKE UP   GERD (gastroesophageal reflux disease)    Prostate enlargement    RBBB    RLS (restless legs syndrome)    Wears dentures    partial upper    Surgical History: Past Surgical History:  Procedure Laterality Date   COLONOSCOPY  2015?   Dr Christopher Aguirre   HERNIA REPAIR Left 12/01/2005   Dr Christopher Aguirre.  inguinal   INGUINAL HERNIA REPAIR Right 09/05/2017   Medium Ultra Pro mesh Surgeon: Christopher Mayotte, MD;  Location: [facility];  Service: General;  Laterality: Right;   INGUINAL HERNIA REPAIR Right 03/05/2020   Procedure: HERNIA REPAIR WITH MESH INGUINAL ADULT;   Surgeon: Christopher Mayotte, MD;  Location: [facility];  Service: General;  Laterality: Right;   LIPOMA EXCISION  09/05/2017   Procedure: EXCISION LIPOMA;  Surgeon: Christopher Mayotte, MD;  Location: [facility];  Service: General;;   NASAL TURBINATE REDUCTION Bilateral 01/10/2015   Procedure: TURBINATE REDUCTION/SUBMUCOSAL ;  Surgeon: Christopher Salmons, MD;  Location: [facility];  Service: ENT;  Laterality: Bilateral;   SEPTOPLASTY N/A 01/10/2015   Procedure: SEPTOPLASTY;  Surgeon: Christopher Salmons, MD;  Location: [facility];  Service: ENT;  Laterality: N/A;    Home Medications:  Allergies as of 04/08/2023       Reactions   Tramadol    Hydrocodone Other (See Comments)   Makes patient feel terrible and too woozie   Pollen Extract    Respiratory symptoms        Medication List        Accurate as of April 08, 2023 11:59 PM. If you have any questions, ask your nurse or doctor.          STOP taking these medications    CAL-MAG-ZINC PO Stopped by: Christopher Aguirre   ferrous sulfate 325 (65 FE) MG tablet Stopped by: Christopher Aguirre   loratadine 10 MG tablet Commonly known as: CLARITIN Stopped by: Christopher Aguirre   ondansetron 4 MG disintegrating tablet Commonly known as: Zofran  ODT Stopped by: Christopher Aguirre       TAKE these medications    acetaminophen 500 MG tablet Commonly known as: TYLENOL Take 1,000 mg by mouth daily as needed for moderate pain or headache.   b complex vitamins capsule Take 1 capsule by mouth daily.   B-12 5000 MCG Caps Take 5,000 mcg by mouth daily.   cimetidine 200 MG tablet Commonly known as: TAGAMET Take 200 mg by mouth daily.   famotidine 20 MG tablet Commonly known as: PEPCID Take 20 mg by mouth at bedtime.   finasteride 5 MG tablet Commonly known as: PROSCAR Take 5 mg by mouth at bedtime.   gabapentin 100 MG capsule Commonly known as: NEURONTIN Take 1 capsule (100 mg total) by mouth 3 (three) times daily.    meloxicam 7.5 MG tablet Commonly known as: MOBIC Take 7.5 mg by mouth daily.   multivitamin capsule Take 1 capsule by mouth daily.   rOPINIRole 0.5 MG tablet Commonly known as: REQUIP Take 0.5 mg by mouth at bedtime as needed (for restless leg syndrome).   tamsulosin 0.4 MG Caps capsule Commonly known as: FLOMAX Take 0.8 mg by mouth daily.   trospium 20 MG tablet Commonly known as: SANCTURA Take 1 tablet (20 mg total) by mouth 2 (two) times daily. Started by: Christopher Aguirre   Vitamin D 50 MCG (2000 UT) tablet Take 2,000 Units by mouth daily.   zolpidem 5 MG tablet Commonly known as: AMBIEN Take 2.5 mg by mouth at bedtime as needed for sleep.        Allergies:  Allergies  Allergen Reactions   Tramadol    Hydrocodone Other (See Comments)    Makes patient feel terrible and too woozie   Pollen Extract     Respiratory symptoms    Family History: Family History  Problem Relation Age of Onset   Aneurysm Father     Social History:  reports that he quit smoking about 19 years ago. His smoking use included cigarettes. He has never used smokeless tobacco. He reports current alcohol use of about 7.0 standard drinks of alcohol per week. He reports that he does not use drugs.   Physical Exam: BP 138/74   Pulse 78   Ht 6' (1.829 m)   Wt 159 lb (72.1 kg)   BMI 21.56 kg/m   Constitutional:  Alert and oriented, No acute distress. HEENT: [place] AT Respiratory: Normal respiratory effort, no increased work of breathing. GI: Abdomen is soft, nontender, nondistended, no abdominal masses Psychiatric: Normal mood and affect.  Assessment & Plan:    1. BPH with LUTS No bothersome daytime symptoms on Tamsulosin/Finasteride.  2. Nocturia Discussed that sleep apnea is a common cause of nocturia and I feel his best option would be sleep study testing. He does not want to proceed with sleep study. I did offer him a 30-day trial of an immediate-release anticholinergic medication to  take 1 hour prior to bedtime to see if this improved his symptoms. He did want to try this medication and  Rx Trospium 20mg  1 hour prior to bedtime was sent to pharmacy.  He would like to continue annual follow ups.  I have reviewed the above documentation for accuracy and completeness, and I agree with the above.   Christopher Altes, MD  [facility] [address], [address] [place], [place] [address] [phone]

## 2023-04-11 ENCOUNTER — Encounter: Payer: Self-pay | Admitting: Urology

## 2023-04-25 ENCOUNTER — Encounter: Payer: Medicare Other | Admitting: Urology

## 2023-05-05 ENCOUNTER — Other Ambulatory Visit: Payer: Self-pay | Admitting: Urology

## 2023-05-16 ENCOUNTER — Encounter: Payer: Self-pay | Admitting: Urology

## 2023-09-14 ENCOUNTER — Encounter: Payer: Self-pay | Admitting: Urology

## 2023-11-10 ENCOUNTER — Encounter: Payer: Self-pay | Admitting: Cardiovascular Disease

## 2023-11-10 ENCOUNTER — Ambulatory Visit: Attending: Cardiovascular Disease | Admitting: Cardiovascular Disease

## 2023-11-10 ENCOUNTER — Ambulatory Visit

## 2023-11-10 VITALS — BP 110/76 | HR 62 | Ht 72.0 in | Wt 189.0 lb

## 2023-11-10 DIAGNOSIS — R0789 Other chest pain: Secondary | ICD-10-CM | POA: Diagnosis not present

## 2023-11-10 DIAGNOSIS — I4892 Unspecified atrial flutter: Secondary | ICD-10-CM | POA: Diagnosis present

## 2023-11-10 MED ORDER — APIXABAN 5 MG PO TABS: 5.0000 mg | ORAL_TABLET | Freq: Two times a day (BID) | ORAL | 11 refills | Status: AC

## 2023-11-10 NOTE — Patient Instructions (Signed)
 Medication Instructions:  START Eliquis 5 mg twice daily  *If you need a refill on your cardiac medications before your next appointment, please call your pharmacy*  Lab Work: None ordered If you have labs (blood work) drawn today and your tests are completely normal, you will receive your results only by: MyChart Message (if you have MyChart) OR A paper copy in the mail If you have any lab test that is abnormal or we need to change your treatment, we will call you to review the results.  Testing/Procedures: Your physician has requested that you have an echocardiogram. Echocardiography is a painless test that uses sound waves to create images of your heart. It provides your doctor with information about the size and shape of your heart and how well your heart's chambers and valves are working.   You may receive an ultrasound enhancing agent through an IV if needed to better visualize your heart during the echo. This procedure takes approximately one hour.  There are no restrictions for this procedure.  This will take place at [address] [facility] [address], [place] [address]  Please note: We ask at that you not bring children with you during ultrasound (echo/ vascular) testing. Due to room size and safety concerns, children are not allowed in the ultrasound rooms during exams. Our front office staff cannot provide observation of children in our lobby area while testing is being conducted. An adult accompanying a patient to their appointment will only be allowed in the ultrasound room at the discretion of the ultrasound technician under special circumstances. We apologize for any inconvenience.   Follow-Up: At [facility], you and your health needs are our priority.  As part of our continuing mission to provide you with exceptional heart care, our providers are all part of one team.  This team includes your primary Cardiologist (physician) and Advanced Practice  Providers or APPs (Physician Assistants and Nurse Practitioners) who all work together to provide you with the care you need, when you need it.  Your next appointment:   A referral has been placed to Electrophysiology  We recommend signing up for the patient portal called MyChart.  Sign up information is provided on this After Visit Summary.  MyChart is used to connect with patients for Virtual Visits (Telemedicine).  Patients are able to view lab/test results, encounter notes, upcoming appointments, etc.  Non-urgent messages can be sent to your provider as well.   To learn more about what you can do with MyChart, go to [email or web address].   Other Instructions ZIO XT- Long Term Monitor Instructions  Your physician has requested you wear a ZIO patch monitor for 14 days.  This is a single patch monitor. [employer] supplies one patch monitor per enrollment. Additional stickers are not available. Please do not apply patch if you will be having a Nuclear Stress Test, Echocardiogram, Cardiac CT, MRI, or Chest Xray during the period you would be wearing the monitor. The patch cannot be worn during these tests. You cannot remove and re-apply the ZIO XT patch monitor.  Your ZIO patch monitor will be mailed 3 day USPS to your address on file. It may take 3-5 days to receive your monitor after you have been enrolled. Once you have received your monitor, please review the enclosed instructions. Your monitor has already been registered assigning a specific monitor serial number to you.  Billing and Patient Assistance Program Information  We have supplied [employer] with any of your insurance information on  file for billing purposes.  [employer] offers a sliding scale Patient Assistance Program for patients that do not have insurance, or whose insurance does not completely cover the cost of the ZIO monitor.  You must apply for the Patient Assistance Program to qualify for this discounted rate.  To apply, please  call [employer] at [phone], select option 4, select option 2, ask to apply for Patient Assistance Program. M[place]eth will ask your household income, and how many people are in your household. They will quote your out-of-pocket cost based on that information. [employer] will also be able to set up a 30-month, interest-free payment plan if needed.  Applying the monitor   Shave hair from upper left chest.  Hold abrader disc by orange tab. Rub abrader in 40 strokes over the upper left chest as indicated in your monitor instructions.  Clean area with 4 enclosed alcohol pads. Let dry.  Apply patch as indicated in monitor instructions. Patch will be placed under collarbone on left side of chest with arrow pointing upward.  Rub patch adhesive wings for 2 minutes. Remove white label marked 1. Remove the white label marked 2. Rub patch adhesive wings for 2 additional minutes.  While looking in a mirror, press and release button in center of patch. A small green light will flash 3-4 times. This will be your only indicator that the monitor has been turned on.  Do not shower for the first 24 hours. You may shower after the first 24 hours.  Press the button if you feel a symptom. You will hear a small click. Record Date, Time and Symptom in the Patient Logbook.  When you are ready to remove the patch, follow instructions on the last 2 pages of Patient Logbook.  Stick patch monitor into the tabs at the bottom of the return box.  Place Patient Logbook in the blue and white box. Use locking tab on box and tape box closed securely. The blue and white box has prepaid postage on it. Please place it in the mailbox as soon as possible. Your physician should have your test results approximately 7-14 days after the monitor has been mailed back to [facility].  Call [facility] Customer Care at [phone] if you have questions regarding your ZIO XT patch monitor.  Call them immediately if you see an orange light  blinking on your monitor.  If your monitor falls off in less than 4 days, contact our Monitor department at [phone].  If your monitor becomes loose or falls off after 4 days call [employer] at [phone] for suggestions on securing your monitor.

## 2023-11-10 NOTE — Progress Notes (Unsigned)
 Cardiology Office Note   Date:  11/10/2023   ID:  Christopher Aguirre, DOB Dec 10, 1940, MRN [id number]  PCP:  Christopher Manna, MD  Cardiologist:   Christopher Cage, MD   Chief Complaint  Patient presents with   New Patient (Initial Visit)    Est. Care/Chest discomfort. Meds reviewed verbally with pt.      History of Present Illness: Christopher Aguirre is a 83 y.o. male who presents to reestablish cardiovascular care. He was seen in the past by me for asymptomatic PVCs.  He was most recently seen in January 2022.  He is a previous smoker with family history of coronary artery disease. He was seen in 2019 for frequent PVCs.  Holter monitor showed 1500 PVCs in 24 hours.  Treadmill stress test showed no evidence of ischemia with normal ejection fraction.  His wife has been dealing with atrial fibrillation and is scheduled to have an ablation done at [facility].  He experienced few episodes of chest pain described as twinges lasting few seconds but he has no chest tightness or exertional symptoms.  No shortness of breath.  He is active overall with no exertional symptoms.  He is noted to be in atrial flutter with controlled ventricular rate today.  No previous history of this and no prior history of atrial fibrillation or CVA.    Past Medical History:  Diagnosis Date   Arthritis    shoulder, collar bone   Asymptomatic PVCs    a. 09/2017 Holter: 1% PVC burden; b. 10/2017 MV: Ex time: 7:00. EF 63%, rare PVCs. No ischemia/infarct.   Family history of adverse reaction to anesthesia    MOTHER SLOW TO WAKE UP   GERD (gastroesophageal reflux disease)    Prostate enlargement    RBBB    RLS (restless legs syndrome)    Wears dentures    partial upper    Past Surgical History:  Procedure Laterality Date   COLONOSCOPY  2015?   Dr Christopher Aguirre   HERNIA REPAIR Left 12/01/2005   Dr Christopher.  inguinal   INGUINAL HERNIA REPAIR Right 09/05/2017   Medium Ultra Pro mesh Surgeon: Christopher Aguirre ORN, MD;   Location: [facility];  Service: General;  Laterality: Right;   INGUINAL HERNIA REPAIR Right 03/05/2020   Procedure: HERNIA REPAIR WITH MESH INGUINAL ADULT;  Surgeon: Christopher Aguirre ORN, MD;  Location: [facility];  Service: General;  Laterality: Right;   LIPOMA EXCISION  09/05/2017   Procedure: EXCISION LIPOMA;  Surgeon: Christopher Aguirre ORN, MD;  Location: [facility];  Service: General;;   NASAL TURBINATE REDUCTION Bilateral 01/10/2015   Procedure: TURBINATE REDUCTION/SUBMUCOSAL ;  Surgeon: Christopher Hasten, MD;  Location: [facility];  Service: ENT;  Laterality: Bilateral;   SEPTOPLASTY N/A 01/10/2015   Procedure: SEPTOPLASTY;  Surgeon: Christopher Hasten, MD;  Location: [facility];  Service: ENT;  Laterality: N/A;     Current Outpatient Medications  Medication Sig Dispense Refill   acetaminophen  (TYLENOL ) 500 MG tablet Take 1,000 mg by mouth daily as needed for moderate pain or headache.     b complex vitamins capsule Take 1 capsule by mouth daily.     Cholecalciferol (VITAMIN D) 50 MCG (2000 UT) tablet Take 2,000 Units by mouth daily.     cimetidine (TAGAMET) 200 MG tablet Take 200 mg by mouth daily.     Cyanocobalamin (B-12) 5000 MCG CAPS Take 5,000 mcg by mouth daily.     famotidine (PEPCID) 20 MG tablet Take 20 mg by mouth  at bedtime.     finasteride (PROSCAR) 5 MG tablet Take 5 mg by mouth at bedtime.   1   gabapentin  (NEURONTIN ) 300 MG capsule Take 300 mg by mouth 2 (two) times daily.     loratadine (CLARITIN) 10 MG tablet Take 10 mg by mouth daily.     meloxicam (MOBIC) 7.5 MG tablet Take 7.5 mg by mouth daily.     Multiple Vitamin (MULTIVITAMIN) capsule Take 1 capsule by mouth daily.      rOPINIRole (REQUIP) 0.5 MG tablet Take 0.5 mg by mouth at bedtime as needed (for restless leg syndrome).     tamsulosin (FLOMAX) 0.4 MG CAPS capsule Take 0.8 mg by mouth daily.      zolpidem (AMBIEN) 5 MG tablet Take 2.5 mg by mouth at bedtime as needed for sleep.     ipratropium (ATROVENT)  0.03 % nasal spray Place 1 spray into both nostrils daily.     trospium  (SANCTURA ) 20 MG tablet TAKE 1 TABLET(20 MG) BY MOUTH TWICE DAILY (Patient not taking: Reported on 11/10/2023) 30 tablet 0   No current facility-administered medications for this visit.    Allergies:   Tramadol , Hydrocodone , and Pollen extract    Social History:  The patient  reports that he quit smoking about 19 years ago. His smoking use included cigarettes. He has never used smokeless tobacco. He reports current alcohol use of about 7.0 standard drinks of alcohol per week. He reports that he does not use drugs.   Family History:  The patient's family history includes Aneurysm in his father.    ROS:  Please see the history of present illness.   Otherwise, review of systems are positive for none.   All other systems are reviewed and negative.    PHYSICAL EXAM: VS:  BP 110/76 (BP Location: Right Arm, Cuff Size: Large)   Pulse 62   Ht 6' (1.829 m)   Wt 189 lb (85.7 kg)   SpO2 96%   BMI 25.63 kg/m  , BMI Body mass index is 25.63 kg/m. GEN: Well nourished, well developed, in no acute distress  HEENT: normal  Neck: no JVD, carotid bruits, or masses Cardiac: RRR; no murmurs, rubs, or gallops,no edema  Respiratory:  clear to auscultation bilaterally, normal work of breathing GI: soft, nontender, nondistended, + BS MS: no deformity or atrophy  Skin: warm and dry, no rash Neuro:  Strength and sensation are intact Psych: euthymic mood, full affect   EKG:  EKG is ordered today. The ekg ordered today demonstrates : Atrial flutter with 4:1 A-V conduction Right bundle branch block When compared with ECG of 31-Aug-2017 14:45, Atrial flutter has replaced Sinus rhythm Left anterior fascicular block is no longer Present QT has lengthened    Recent Labs: No results found for requested labs within last 365 days.    Lipid Panel No results found for: CHOL, TRIG, HDL, CHOLHDL, VLDL, LDLCALC,  LDLDIRECT    Wt Readings from Last 3 Encounters:  11/10/23 189 lb (85.7 kg)  04/08/23 159 lb (72.1 kg)  03/05/20 195 lb 15.8 oz (88.9 kg)         11/09/2023    1:01 PM 09/02/2017   11:15 AM  PAD Screen  Previous PAD dx? No No  Previous surgical procedure? No No  Pain with walking? No No  Feet/toe relief with dangling? No No  Painful, non-healing ulcers? No No  Extremities discolored? No No      ASSESSMENT AND PLAN:  1.  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  Chads Vascor is 4 given his age.  Thus, I recommend anticoagulation.  I started him on anticoagulation with Eliquis 5 mg twice daily. Will get a 2-week ZIO monitor to evaluate for any other associated arrhythmia we will obtain an echocardiogram. Recommend referral to EP to consider atrial flutter ablation to eliminate the need for long-term anticoagulation.  2.  History of PVCs: Appears to be asymptomatic with no evidence of PVCs today.  3.  Noncardiac chest pain: He reports twinges in the chest lasting few seconds.  His symptoms are not consistent with angina and no need for ischemic cardiac evaluation.    Disposition:   Referred to EP for atrial flutter ablation.  Signed,  Christopher Cage, MD  11/10/2023 3:04 PM    [facility]

## 2023-11-11 ENCOUNTER — Telehealth: Payer: Self-pay | Admitting: Cardiovascular Disease

## 2023-11-11 NOTE — Telephone Encounter (Signed)
 Called patient. He wanted to know if he had to have the echo done before wearing his heart monitor. Told patient it did not matter which one he got done first. Patient verbalized understanding and will put the heart monitor on when he receives it in the mail.

## 2023-11-11 NOTE — Telephone Encounter (Signed)
 Patient would like to know if he can wear heart monitor before having echo. Please advise.

## 2023-11-11 NOTE — Telephone Encounter (Signed)
 Left a message for the patient to call back.

## 2023-12-05 ENCOUNTER — Encounter: Payer: Self-pay | Admitting: Cardiovascular Disease

## 2023-12-10 ENCOUNTER — Ambulatory Visit: Payer: Self-pay | Admitting: Cardiovascular Disease

## 2023-12-10 DIAGNOSIS — I4892 Unspecified atrial flutter: Secondary | ICD-10-CM

## 2023-12-12 NOTE — Telephone Encounter (Signed)
 Patient made aware of results and verbalized understanding. The patient has been compliant with his Eliquis.   Appointment made with Suzann Riddle, NP on 11/13.  Message sent to scheduling for sooner echo appointment.

## 2023-12-12 NOTE — Telephone Encounter (Signed)
-----   Message from Deatrice Cage sent at 12/10/2023 11:44 AM EDT ----- Inform patient that outpatient monitor confirmed that he is continuously in atrial flutter.  Please make sure he is taking Eliquis regularly to minimize his risk of stroke.   He was also noted to have pauses up to 3 seconds.  Please expedite his echo and EP consult. ----- Message ----- From: Cage Deatrice LABOR, MD Sent: 12/10/2023  11:42 AM EDT To: Deatrice LABOR Cage, MD

## 2023-12-22 ENCOUNTER — Ambulatory Visit: Attending: Cardiology | Admitting: Cardiology

## 2023-12-22 ENCOUNTER — Encounter: Payer: Self-pay | Admitting: Cardiology

## 2023-12-22 VITALS — BP 130/76 | HR 63 | Ht 72.0 in | Wt 189.8 lb

## 2023-12-22 DIAGNOSIS — I4892 Unspecified atrial flutter: Secondary | ICD-10-CM | POA: Diagnosis present

## 2023-12-22 DIAGNOSIS — D6869 Other thrombophilia: Secondary | ICD-10-CM | POA: Diagnosis present

## 2023-12-22 DIAGNOSIS — G479 Sleep disorder, unspecified: Secondary | ICD-10-CM | POA: Insufficient documentation

## 2023-12-22 NOTE — H&P (View-Only) (Signed)
 Electrophysiology Clinic Note    Date:  12/22/2023  Patient ID:  Christopher Aguirre, DOB [birth year], MRN [id number] PCP:  Sadie Manna, MD  Cardiologist:  Deatrice Cage, MD  Electrophysiologist:  Fonda Kitty, MD  Electrophysiology APP:  Krystiana Fornes, NP     Discussed the use of AI scribe software for clinical note transcription with the patient, who gave verbal consent to proceed.   Patient Profile    Chief Complaint: Atrial flutter  History of Present Illness: Christopher Aguirre is a 83 y.o. male with PMH notable for atrial flutter, PVC, previous smoker ; seen today electrophysiology evaluation of atrial flutter.   He saw Dr. Cage 11/2023 to re-establish care d/t intermittent chest discomfort. He was in aflutter at that visit and was started on eliquis.  An updated zio monitor was ordered which showed 100% Aflutter burden with 3 second pauses occuring the early morning. Updated TTE is pending.    Today, he is completely asymptomatic of atrial arrhythmia, though he does note that the other night when laying down a certain way he felt his heart beating irregularly. Otherwise, he denies chest pain, chest pressure, palpitations. No SOB, edema.  He diligently takes eliquis BID, no missed doses, no bleeding concerns.   He states that he is a night owl and goes to bed around 1:30 am, and sleeps until 8-9am everyday. He does not recall feeling dizzy, LH, or presyncopal while wearing the ambulatory monitor.  His wife routinely told him he snored, he does have some daytime fatigue but does not nod off during the day.       Arrhythmia/Device History No specialty comments available.    ROS:  Please see the history of present illness. All other systems are reviewed and otherwise negative.    Physical Exam    VS:  BP 130/76 (BP Location: Left Arm, Patient Position: Sitting, Cuff Size: Normal)   Pulse 63   Ht 6' (1.829 m)   Wt 189 lb 12.8 oz (86.1 kg)   SpO2 97%   BMI  25.74 kg/m  BMI: Body mass index is 25.74 kg/m.    STOP-Bang Score:  4         Wt Readings from Last 3 Encounters:  12/22/23 189 lb 12.8 oz (86.1 kg)  11/10/23 189 lb (85.7 kg)  04/08/23 159 lb (72.1 kg)     GEN- The patient is well appearing, alert and oriented x 3 today.   Lungs- Clear to ausculation bilaterally, normal work of breathing.  Heart- Irregularly irregular rate and rhythm, no murmurs, rubs or gallops Extremities- No peripheral edema, warm, dry    Studies Reviewed   Previous EP, cardiology notes.    EKG is ordered. Personal review of EKG from today shows:    EKG Interpretation Date/Time:  Thursday December 22 2023 14:55:20 EST Ventricular Rate:  63 PR Interval:    QRS Duration:  120 QT Interval:  520 QTC Calculation: 532 R Axis:   -73  Text Interpretation: Atrial flutter with variable A-V block with premature ventricular or aberrantly conducted complexes Left axis deviation Pulmonary disease pattern Confirmed by Dae Antonucci [phone]) on 12/22/2023 3:00:10 PM    Long term monitor, 12/05/2023 Patch Wear Time:  13 days and 23 hours (2025-10-04T17:58:38-0400 to 2025-10-18T17:58:30-0400)   2 Ventricular Tachycardia runs occurred, the run with the fastest interval lasting 5 beats with a max rate of 164 bpm, the longest lasting 7 beats with an avg rate of 146 bpm. Atrial Flutter  occurred continuously (100% burden), ranging from 31-128 bpm  (avg of 64 bpm). Bundle Branch Block/IVCD was present. 7 Pauses occurred, the longest lasting 3 secs (20 bpm).   NM Myocardial spect, 11/04/2017 Exercise myocardial perfusion imaging study with no significant  ischemia Normal wall motion, EF estimated at 63% No EKG changes concerning for ischemia at peak stress or in recovery. Resting EKG with right bundle branch block Rare PVCs, rare couplet Hypertensive with exercise Target heart rate achieved, exercise time 7 minutes, achieved 7 METS Low risk scan  24h Holter monitor,  09/16/2017 Normal sinus rhythm. Occasional PVCs with a total of 1500 beats and 24 hours representing 1% burden.   Assessment and Plan     #) atrial flutter Asymptomatic of arrhythmia, incidentally diagnosed during routine outpatient appointment Recent zio with 100% Aflutter burden Ventricular rates well-controlled TTE scheduled, previously had normal LVEF Will proceed with DCCV Update BMP, Mag, CBC today He appears to be appropriate ablation candidate, will defer to MD for final decision   #) Hypercoag d/t aflutter CHA2DS2-VASc Score = at least 2 [CHF History: 0, HTN History: 0, Diabetes History: 0, Stroke History: 0, Vascular Disease History: 0, Age Score: 2, Gender Score: 0].  Therefore, the patient's annual risk of stroke is 2.2 %.    Stroke ppx - 5mg  eliquis BID, appropriately dosed No bleeding concerns No missed doses   #) nocturnal pauses #) sleep disordered breathing High concern for OSA given nocturnal pauses during sleep Stop bang score = 4 Refer to pulm for further eval/treatment He is hesitant to consider CPAP, but willing to discuss further with pulm.     Informed Consent   Shared Decision Making/Informed Consent The risks (stroke, cardiac arrhythmias rarely resulting in the need for a temporary or permanent pacemaker, skin irritation or burns and complications associated with conscious sedation including aspiration, arrhythmia, respiratory failure and death), benefits (restoration of normal sinus rhythm) and alternatives of a direct current cardioversion were explained in detail to Christopher Aguirre and he agrees to proceed.        Current medicines are reviewed at length with the patient today.   The patient has concerns regarding his medicines.  The following changes were made today:  none  Labs/ tests ordered today include:  Orders Placed This Encounter  Procedures   CBC   Basic metabolic panel with GFR   Magnesium   Ambulatory referral to Pulmonology   EKG  12-Lead     Disposition: Follow up with Dr. Kennyth as scheduled    Signed, Skyra Crichlow, NP  12/22/23  3:34 PM  Electrophysiology [facility]

## 2023-12-22 NOTE — Progress Notes (Signed)
 Electrophysiology Clinic Note    Date:  12/22/2023  Patient ID:  Christopher Aguirre, DOB [birth year], MRN [id number] PCP:  Sadie Manna, MD  Cardiologist:  Deatrice Cage, MD  Electrophysiologist:  Fonda Kitty, MD  Electrophysiology APP:  Krystiana Fornes, NP     Discussed the use of AI scribe software for clinical note transcription with the patient, who gave verbal consent to proceed.   Patient Profile    Chief Complaint: Atrial flutter  History of Present Illness: Christopher Aguirre is a 83 y.o. male with PMH notable for atrial flutter, PVC, previous smoker ; seen today electrophysiology evaluation of atrial flutter.   He saw Dr. Cage 11/2023 to re-establish care d/t intermittent chest discomfort. He was in aflutter at that visit and was started on eliquis.  An updated zio monitor was ordered which showed 100% Aflutter burden with 3 second pauses occuring the early morning. Updated TTE is pending.    Today, he is completely asymptomatic of atrial arrhythmia, though he does note that the other night when laying down a certain way he felt his heart beating irregularly. Otherwise, he denies chest pain, chest pressure, palpitations. No SOB, edema.  He diligently takes eliquis BID, no missed doses, no bleeding concerns.   He states that he is a night owl and goes to bed around 1:30 am, and sleeps until 8-9am everyday. He does not recall feeling dizzy, LH, or presyncopal while wearing the ambulatory monitor.  His wife routinely told him he snored, he does have some daytime fatigue but does not nod off during the day.       Arrhythmia/Device History No specialty comments available.    ROS:  Please see the history of present illness. All other systems are reviewed and otherwise negative.    Physical Exam    VS:  BP 130/76 (BP Location: Left Arm, Patient Position: Sitting, Cuff Size: Normal)   Pulse 63   Ht 6' (1.829 m)   Wt 189 lb 12.8 oz (86.1 kg)   SpO2 97%   BMI  25.74 kg/m  BMI: Body mass index is 25.74 kg/m.    STOP-Bang Score:  4         Wt Readings from Last 3 Encounters:  12/22/23 189 lb 12.8 oz (86.1 kg)  11/10/23 189 lb (85.7 kg)  04/08/23 159 lb (72.1 kg)     GEN- The patient is well appearing, alert and oriented x 3 today.   Lungs- Clear to ausculation bilaterally, normal work of breathing.  Heart- Irregularly irregular rate and rhythm, no murmurs, rubs or gallops Extremities- No peripheral edema, warm, dry    Studies Reviewed   Previous EP, cardiology notes.    EKG is ordered. Personal review of EKG from today shows:    EKG Interpretation Date/Time:  Thursday December 22 2023 14:55:20 EST Ventricular Rate:  63 PR Interval:    QRS Duration:  120 QT Interval:  520 QTC Calculation: 532 R Axis:   -73  Text Interpretation: Atrial flutter with variable A-V block with premature ventricular or aberrantly conducted complexes Left axis deviation Pulmonary disease pattern Confirmed by Dae Antonucci [phone]) on 12/22/2023 3:00:10 PM    Long term monitor, 12/05/2023 Patch Wear Time:  13 days and 23 hours (2025-10-04T17:58:38-0400 to 2025-10-18T17:58:30-0400)   2 Ventricular Tachycardia runs occurred, the run with the fastest interval lasting 5 beats with a max rate of 164 bpm, the longest lasting 7 beats with an avg rate of 146 bpm. Atrial Flutter  occurred continuously (100% burden), ranging from 31-128 bpm  (avg of 64 bpm). Bundle Branch Block/IVCD was present. 7 Pauses occurred, the longest lasting 3 secs (20 bpm).   NM Myocardial spect, 11/04/2017 Exercise myocardial perfusion imaging study with no significant  ischemia Normal wall motion, EF estimated at 63% No EKG changes concerning for ischemia at peak stress or in recovery. Resting EKG with right bundle branch block Rare PVCs, rare couplet Hypertensive with exercise Target heart rate achieved, exercise time 7 minutes, achieved 7 METS Low risk scan  24h Holter monitor,  09/16/2017 Normal sinus rhythm. Occasional PVCs with a total of 1500 beats and 24 hours representing 1% burden.   Assessment and Plan     #) atrial flutter Asymptomatic of arrhythmia, incidentally diagnosed during routine outpatient appointment Recent zio with 100% Aflutter burden Ventricular rates well-controlled TTE scheduled, previously had normal LVEF Will proceed with DCCV Update BMP, Mag, CBC today He appears to be appropriate ablation candidate, will defer to MD for final decision   #) Hypercoag d/t aflutter CHA2DS2-VASc Score = at least 2 [CHF History: 0, HTN History: 0, Diabetes History: 0, Stroke History: 0, Vascular Disease History: 0, Age Score: 2, Gender Score: 0].  Therefore, the patient's annual risk of stroke is 2.2 %.    Stroke ppx - 5mg  eliquis BID, appropriately dosed No bleeding concerns No missed doses   #) nocturnal pauses #) sleep disordered breathing High concern for OSA given nocturnal pauses during sleep Stop bang score = 4 Refer to pulm for further eval/treatment He is hesitant to consider CPAP, but willing to discuss further with pulm.     Informed Consent   Shared Decision Making/Informed Consent The risks (stroke, cardiac arrhythmias rarely resulting in the need for a temporary or permanent pacemaker, skin irritation or burns and complications associated with conscious sedation including aspiration, arrhythmia, respiratory failure and death), benefits (restoration of normal sinus rhythm) and alternatives of a direct current cardioversion were explained in detail to Mr. Oshields and he agrees to proceed.        Current medicines are reviewed at length with the patient today.   The patient has concerns regarding his medicines.  The following changes were made today:  none  Labs/ tests ordered today include:  Orders Placed This Encounter  Procedures   CBC   Basic metabolic panel with GFR   Magnesium   Ambulatory referral to Pulmonology   EKG  12-Lead     Disposition: Follow up with Dr. Kennyth as scheduled    Signed, Skyra Crichlow, NP  12/22/23  3:34 PM  Electrophysiology [facility]

## 2023-12-22 NOTE — Patient Instructions (Signed)
 Medication Instructions:  Your physician recommends that you continue on your current medications as directed. Please refer to the Current Medication list given to you today.   *If you need a refill on your cardiac medications before your next appointment, please call your pharmacy*  Lab Work: Your provider would like for you to have following labs drawn today CBC, Magnesium and BMP.   If you have labs (blood work) drawn today and your tests are completely normal, you will receive your results only by: MyChart Message (if you have MyChart) OR A paper copy in the mail If you have any lab test that is abnormal or we need to change your treatment, we will call you to review the results.  Testing/Procedures:     Dear Christopher Aguirre  You are scheduled for a Cardioversion on Thursday, November 20 with Dr. Gollan.  Please arrive at the [facility] Entrance of [facility], [address], [place] [address] at 6:30 AM (This is 1 hour(s) prior to your procedure time).  Proceed to the Check-In Desk directly inside the entrance.  Procedure Parking: Use the entrance off of the [address] side of the hospital. Turn right upon entering and follow the driveway to parking that is directly in front of the [facility]. There is no valet parking available at this entrance, however there is an awning directly in front of the [facility] for drop off/ pick up for patients.    DIET:  Nothing to eat or drink after midnight except a sip of water with medications (see medication instructions below)  MEDICATION INSTRUCTIONS: !!IF ANY NEW MEDICATIONS ARE STARTED AFTER TODAY, PLEASE NOTIFY YOUR PROVIDER AS SOON AS POSSIBLE!!  FYI: Medications such as Semaglutide (Ozempic, Wegovy), Tirzepatide (Mounjaro, Zepbound), Dulaglutide (Trulicity), etc (GLP1 agonists) AND Canagliflozin (Invokana), Dapagliflozin (Farxiga), Empagliflozin (Jardiance), Ertugliflozin (Steglatro), Bexagliflozin  [employer]) or any combination with one of these drugs such as Invokamet (Canagliflozin/Metformin), Synjardy (Empagliflozin/Metformin), etc (SGLT2 inhibitors) must be held around the time of a procedure. This is not a comprehensive list of all of these drugs. Please review all of your medications and talk to your provider if you take any one of these. If you are not sure, ask your provider.          :1}Continue taking your anticoagulant (blood thinner): Apixaban (Eliquis).  You will need to continue this after your procedure until you are told by your provider that it is safe to stop.    FYI:  For your safety, and to allow us  to monitor your vital signs accurately during the surgery/procedure we request: If you have artificial nails, gel coating, SNS etc, please have those removed prior to your surgery/procedure. Not having the nail coverings /polish removed may result in cancellation or delay of your surgery/procedure.  You must have a responsible person to drive you home and stay in the waiting area during your procedure. Failure to do so could result in cancellation.  Bring your insurance cards.  *Special Note: Every effort is made to have your procedure done on time. Occasionally there are emergencies that occur at the hospital that may cause delays. Please be patient if a delay does occur.      Follow-Up: At [facility], you and your health needs are our priority.  As part of our continuing mission to provide you with exceptional heart care, our providers are all part of one team.  This team includes your primary Cardiologist (physician)  and Advanced Practice Providers or APPs (Physician Assistants and Nurse Practitioners) who all work together to provide you with the care you need, when you need it.  Your next appointment:   February 21, 2024 at 11:40 AM  Provider:   Dr. Kennyth    We recommend signing up for the patient portal called MyChart.  Sign up information is  provided on this After Visit Summary.  MyChart is used to connect with patients for Virtual Visits (Telemedicine).  Patients are able to view lab/test results, encounter notes, upcoming appointments, etc.  Non-urgent messages can be sent to your provider as well.   To learn more about what you can do with MyChart, go to [email or web address].

## 2023-12-23 ENCOUNTER — Ambulatory Visit: Payer: Self-pay | Admitting: Cardiology

## 2023-12-23 LAB — BASIC METABOLIC PANEL WITH GFR
BUN/Creatinine Ratio: 23 (ref 10–24)
BUN: 27 mg/dL (ref 8–27)
CO2: 25 mmol/L (ref 20–29)
Calcium: 10.1 mg/dL (ref 8.6–10.2)
Chloride: 102 mmol/L (ref 96–106)
Creatinine, Ser: 1.2 mg/dL (ref 0.76–1.27)
Glucose: 98 mg/dL (ref 70–99)
Potassium: 5.1 mmol/L (ref 3.5–5.2)
Sodium: 139 mmol/L (ref 134–144)
eGFR: 61 mL/min/1.73 (ref >59)

## 2023-12-23 LAB — CBC
Hematocrit: 48.7 % (ref 37.5–51.0)
Hemoglobin: 16.5 g/dL (ref 13.0–17.7)
MCH: 32.4 pg (ref 26.6–33.0)
MCHC: 33.9 g/dL (ref 31.5–35.7)
MCV: 96 fL (ref 79–97)
Platelets: 199 x10E3/uL (ref 150–450)
RBC: 5.09 x10E6/uL (ref 4.14–5.80)
RDW: 12.9 % (ref 11.6–15.4)
WBC: 6.4 x10E3/uL (ref 3.4–10.8)

## 2023-12-23 LAB — MAGNESIUM: Magnesium: 2.1 mg/dL (ref 1.6–2.3)

## 2023-12-28 MED ORDER — SODIUM CHLORIDE 0.9 % IV SOLN: INTRAVENOUS | Status: DC

## 2023-12-29 ENCOUNTER — Ambulatory Visit: Admitting: Anesthesiology

## 2023-12-29 ENCOUNTER — Encounter: Admission: RE | Disposition: A | Payer: Self-pay | Source: Home / Self Care | Attending: Cardiovascular Disease

## 2023-12-29 ENCOUNTER — Ambulatory Visit
Admission: RE | Admit: 2023-12-29 | Discharge: 2023-12-29 | Disposition: A | Discharge status: Home / Self Care | Attending: Cardiovascular Disease | Admitting: Cardiovascular Disease

## 2023-12-29 DIAGNOSIS — I4891 Unspecified atrial fibrillation: Secondary | ICD-10-CM | POA: Insufficient documentation

## 2023-12-29 DIAGNOSIS — M1990 Unspecified osteoarthritis, unspecified site: Secondary | ICD-10-CM | POA: Diagnosis not present

## 2023-12-29 DIAGNOSIS — I4892 Unspecified atrial flutter: Secondary | ICD-10-CM | POA: Insufficient documentation

## 2023-12-29 DIAGNOSIS — K219 Gastro-esophageal reflux disease without esophagitis: Secondary | ICD-10-CM | POA: Insufficient documentation

## 2023-12-29 DIAGNOSIS — Z87891 Personal history of nicotine dependence: Secondary | ICD-10-CM | POA: Insufficient documentation

## 2023-12-29 DIAGNOSIS — G4730 Sleep apnea, unspecified: Secondary | ICD-10-CM | POA: Diagnosis not present

## 2023-12-29 DIAGNOSIS — R0602 Shortness of breath: Secondary | ICD-10-CM

## 2023-12-29 DIAGNOSIS — I483 Typical atrial flutter: Secondary | ICD-10-CM

## 2023-12-29 HISTORY — PX: CARDIOVERSION: SHX1299

## 2023-12-29 SURGERY — CARDIOVERSION
Anesthesia: General

## 2023-12-29 MED ORDER — PROPOFOL 10 MG/ML IV BOLUS
INTRAVENOUS | Status: DC | PRN
  Administered 2023-12-29: 40 mg via INTRAVENOUS
  Administered 2023-12-29 (×2): 20 mg via INTRAVENOUS

## 2023-12-29 MED ORDER — PROPOFOL 1000 MG/100ML IV EMUL
INTRAVENOUS | Status: AC
  Filled 2023-12-29: qty 100

## 2023-12-29 MED ORDER — LIDOCAINE HCL (PF) 2 % IJ SOLN
INTRAMUSCULAR | Status: AC
  Filled 2023-12-29: qty 5

## 2023-12-29 NOTE — Anesthesia Postprocedure Evaluation (Signed)
 Anesthesia Post Note  Patient: Christopher Aguirre  Procedure(s) Performed: CARDIOVERSION  Patient location during evaluation: PACU Anesthesia Type: General Level of consciousness: awake Pain management: satisfactory to patient Vital Signs Assessment: post-procedure vital signs reviewed and stable Respiratory status: spontaneous breathing Cardiovascular status: blood pressure returned to baseline Anesthetic complications: no   No notable events documented.   Last Vitals:  Vitals:   12/29/23 0800 12/29/23 0815  BP: (!) 115/91 138/82  Pulse: 60 60  Resp: 12 14  Temp:  36.5 C  SpO2: 94% 95%    Last Pain:  Vitals:   12/29/23 0815  TempSrc: Oral  PainSc: 0-No pain                 VAN STAVEREN,Addy Mcmannis

## 2023-12-29 NOTE — Discharge Instructions (Signed)
Cardio

## 2023-12-29 NOTE — Anesthesia Preprocedure Evaluation (Signed)
 Anesthesia Evaluation  Patient identified by MRN, date of birth, ID band Patient awake    Reviewed: Allergy & Precautions, NPO status , Patient's Chart, lab work & pertinent test results  Airway Mallampati: II  TM Distance: >3 FB Neck ROM: Full    Dental  (+) Partial Upper   Pulmonary neg pulmonary ROS, sleep apnea , Patient abstained from smoking., former smoker   Pulmonary exam normal breath sounds clear to auscultation       Cardiovascular Exercise Tolerance: Good negative cardio ROS Normal cardiovascular exam+ dysrhythmias Atrial Fibrillation  Rhythm:Irregular Rate:Abnormal     Neuro/Psych negative neurological ROS  negative psych ROS   GI/Hepatic negative GI ROS, Neg liver ROS,GERD  ,,  Endo/Other  negative endocrine ROS    Renal/GU negative Renal ROS  negative genitourinary   Musculoskeletal  (+) Arthritis ,    Abdominal Normal abdominal exam  (+)   Peds negative pediatric ROS (+)  Hematology negative hematology ROS (+)   Anesthesia Other Findings Past Medical History: No date: Arthritis     Comment:  shoulder, collar bone No date: Asymptomatic PVCs     Comment:  a. 09/2017 Holter: 1% PVC burden; b. 10/2017 MV: Ex time:               7:00. EF 63%, rare PVCs. No ischemia/infarct. No date: Atrial flutter (HCC) No date: Family history of adverse reaction to anesthesia     Comment:  MOTHER SLOW TO WAKE UP No date: GERD (gastroesophageal reflux disease) No date: Prostate enlargement No date: RBBB No date: RLS (restless legs syndrome) No date: Wears dentures     Comment:  partial upper  Past Surgical History: 2015?: COLONOSCOPY     Comment:  Dr Viktoria 12/01/2005: HERNIA REPAIR; Left     Comment:  Dr Dessa.  inguinal 09/05/2017: INGUINAL HERNIA REPAIR; Right     Comment:  Medium Ultra Pro mesh Surgeon: Dessa Reyes ORN, MD;                Location: [facility] ORS;  Service: General;  Laterality:                Right; 03/05/2020: INGUINAL HERNIA REPAIR; Right     Comment:  Procedure: HERNIA REPAIR WITH MESH INGUINAL ADULT;                Surgeon: Dessa Reyes ORN, MD;  Location: [facility] ORS;                Service: General;  Laterality: Right; 09/05/2017: LIPOMA EXCISION     Comment:  Procedure: EXCISION LIPOMA;  Surgeon: Dessa Reyes ORN, MD;  Location: [facility] ORS;  Service: General;; 01/10/2015: NASAL TURBINATE REDUCTION; Bilateral     Comment:  Procedure: TURBINATE REDUCTION/SUBMUCOSAL ;  Surgeon:               Chinita Hasten, MD;  Location: [facility];                Service: ENT;  Laterality: Bilateral; 01/10/2015: SEPTOPLASTY; N/A     Comment:  Procedure: SEPTOPLASTY;  Surgeon: Chinita Hasten, MD;                Location: [facility];  Service: ENT;                Laterality: N/A;     Reproductive/Obstetrics negative OB ROS  Anesthesia Physical Anesthesia Plan  ASA: 3  Anesthesia Plan: General   Post-op Pain Management:    Induction: Intravenous  PONV Risk Score and Plan: Propofol  infusion and TIVA  Airway Management Planned: Natural Airway and Nasal Cannula  Additional Equipment:   Intra-op Plan:   Post-operative Plan:   Informed Consent: I have reviewed the patients History and Physical, chart, labs and discussed the procedure including the risks, benefits and alternatives for the proposed anesthesia with the patient or authorized representative who has indicated his/her understanding and acceptance.     Dental Advisory Given  Plan Discussed with: CRNA  Anesthesia Plan Comments:         Anesthesia Quick Evaluation

## 2023-12-29 NOTE — CV Procedure (Signed)
Cardioversion procedure note For atrial flutter, typical  Procedure Details:  Consent: Risks of procedure as well as the alternatives and risks of each were explained to the (patient/caregiver). Consent for procedure obtained.  Time Out: Verified patient identification, verified procedure, site/side was marked, verified correct patient position, special equipment/implants available, medications/allergies/relevent history reviewed, required imaging and test results available. Performed  Patient placed on cardiac monitor, pulse oximetry, supplemental oxygen as necessary.  Sedation given: propofol IV, Dr.  Gomez Cleverly pads placed anterior and posterior chest.   Cardioverted 1 time(s).  Cardioverted at  150 J. Synchronized biphasic Converted to NSR   Evaluation: Findings: Post procedure EKG shows: NSR Complications: None Patient did tolerate procedure well.  Time Spent Directly with the Patient:  13 minutes   Esmond Plants, M.D., Ph.D.

## 2023-12-29 NOTE — Transfer of Care (Signed)
 Immediate Anesthesia Transfer of Care Note  Patient: Christopher Aguirre  Procedure(s) Performed: CARDIOVERSION  Patient Location: PACU  Anesthesia Type:General  Level of Consciousness: sedated  Airway & Oxygen Therapy: Patient Spontanous Breathing and Patient connected to nasal cannula oxygen  Post-op Assessment: Report given to RN and Post -op Vital signs reviewed and stable  Post vital signs: Reviewed and stable  Last Vitals:  Vitals Value Taken Time  BP    Temp    Pulse 58 12/29/23 07:30  Resp 16 12/29/23 07:30  SpO2 97 % 12/29/23 07:30  Vitals shown include unfiled device data.  Last Pain:  Vitals:   12/29/23 0656  TempSrc: Temporal  PainSc: 0-No pain         Complications: No notable events documented.

## 2023-12-30 ENCOUNTER — Encounter: Payer: Self-pay | Admitting: Cardiovascular Disease

## 2023-12-30 NOTE — Interval H&P Note (Signed)
 History and Physical Interval Note:  12/30/2023 1:26 PM  Christopher Aguirre  has presented today for surgery, with the diagnosis of Cardioversion    Afib.  The various methods of treatment have been discussed with the patient and family. After consideration of risks, benefits and other options for treatment, the patient has consented to  Procedure(s): CARDIOVERSION (N/A) as a surgical intervention.  The patient's history has been reviewed, patient examined, no change in status, stable for surgery.  I have reviewed the patient's chart and labs.  Questions were answered to the patient's satisfaction.     Rini Moffit

## 2023-12-30 NOTE — H&P (Signed)

## 2024-01-09 ENCOUNTER — Ambulatory Visit: Attending: Cardiovascular Disease

## 2024-01-09 DIAGNOSIS — I4892 Unspecified atrial flutter: Secondary | ICD-10-CM | POA: Diagnosis not present

## 2024-01-09 LAB — ECHOCARDIOGRAM COMPLETE
AR max vel: 4.03 cm2
AV Area VTI: 3.93 cm2
AV Area mean vel: 4.05 cm2
AV Mean grad: 3 mmHg
AV Peak grad: 5.6 mmHg
Ao pk vel: 1.18 m/s
Area-P 1/2: 2.56 cm2
S' Lateral: 3.35 cm

## 2024-01-11 ENCOUNTER — Encounter: Payer: Self-pay | Admitting: Sleep Medicine

## 2024-01-11 ENCOUNTER — Ambulatory Visit: Admitting: Sleep Medicine

## 2024-01-11 VITALS — BP 120/80 | HR 65 | Temp 97.6°F | Ht 72.0 in | Wt 192.0 lb

## 2024-01-11 DIAGNOSIS — I4892 Unspecified atrial flutter: Secondary | ICD-10-CM | POA: Diagnosis not present

## 2024-01-11 DIAGNOSIS — G4733 Obstructive sleep apnea (adult) (pediatric): Secondary | ICD-10-CM | POA: Diagnosis not present

## 2024-01-11 NOTE — Patient Instructions (Signed)
 Christopher Aguirre

## 2024-01-11 NOTE — Progress Notes (Signed)
 Christopher Aguirre MRN: [id number] DOB: 12/29/1940   CHIEF COMPLAINT:  EXCESSIVE DAYTIME SLEEPINESS   HISTORY OF PRESENT ILLNESS: Christopher Aguirre is a 83 y.o. w/ a h/o atrial flutter, BPH and RLS who present for c/o loud snoring which has been present for several years. Reports nocturnal awakenings due to nocturia, however does not have difficulty falling back to sleep. Denies any significant weight changes. Admits to occasional dry mouth. Denies morning headaches, RLS symptoms, dream enactment, cataplexy, hypnagogic or hypnapompic hallucinations. Denies a family history of sleep apnea. Denies drowsy driving. Drinks 1-2 cups of coffee daily, drinks 1 alcoholic beverage weekly, denies tobacco or illicit drug use.   Bedtime 12:30-1:30 am Sleep onset 10-20 mins Rise time 8-8:30 am   EPWORTH SLEEP SCORE 3    01/11/2024    1:00 PM  Results of the Epworth flowsheet  Sitting and reading 0  Watching TV 1  Sitting, inactive in a public place (e.g. a theatre or a meeting) 0  As a passenger in a car for an hour without a break 0  Lying down to rest in the afternoon when circumstances permit 2  Sitting and talking to someone 0  Sitting quietly after a lunch without alcohol 0  In a car, while stopped for a few minutes in traffic 0  Total score 3    PAST MEDICAL HISTORY :   has a past medical history of Arthritis, Asymptomatic PVCs, Atrial flutter (HCC), Family history of adverse reaction to anesthesia, GERD (gastroesophageal reflux disease), Prostate enlargement, RBBB, RLS (restless legs syndrome), and Wears dentures.  has a past surgical history that includes Septoplasty (N/A, 01/10/2015); Nasal turbinate reduction (Bilateral, 01/10/2015); Colonoscopy (2015?); Hernia repair (Left, 12/01/2005); Inguinal hernia repair (Right, 09/05/2017); Lipoma excision (09/05/2017); Inguinal hernia repair (Right, 03/05/2020); and Cardioversion (N/A, 12/29/2023). Prior to Admission medications   Medication Sig  Start Date End Date Taking? Authorizing Provider  acetaminophen  (TYLENOL ) 500 MG tablet Take 1,000 mg by mouth daily as needed for moderate pain or headache.   Yes [provider]  apixaban  (ELIQUIS ) 5 MG TABS tablet Take 1 tablet (5 mg total) by mouth 2 (two) times daily. 11/10/23  Yes Darron Deatrice LABOR, MD  carboxymethylcellulose (REFRESH PLUS) 0.5 % SOLN 1 drop 3 (three) times daily as needed (dry eyes).   Yes [provider]  Cholecalciferol (VITAMIN D) 50 MCG (2000 UT) tablet Take 2,000 Units by mouth daily.   Yes [provider]  Cyanocobalamin (B-12) 1000 MCG TABS Take 1,000 mcg by mouth daily.   Yes [provider]  finasteride (PROSCAR) 5 MG tablet Take 5 mg by mouth at bedtime.  07/13/17  Yes [provider]  gabapentin  (NEURONTIN ) 300 MG capsule Take 300 mg by mouth 2 (two) times daily.   Yes [provider]  ipratropium (ATROVENT) 0.03 % nasal spray Place 1 spray into both nostrils daily.   Yes [provider]  meloxicam (MOBIC) 7.5 MG tablet Take 7.5 mg by mouth daily.   Yes [provider]  Multiple Vitamin (MULTIVITAMIN) capsule Take 1 capsule by mouth daily.    Yes [provider]  rOPINIRole (REQUIP) 0.5 MG tablet Take 0.5 mg by mouth at bedtime as needed (for restless leg syndrome).   Yes [provider]  tamsulosin (FLOMAX) 0.4 MG CAPS capsule Take 0.8 mg by mouth daily.    Yes [provider]  zolpidem (AMBIEN) 5 MG tablet Take 2.5 mg by mouth at bedtime as needed  for sleep.   Yes [provider]   Allergies  Allergen Reactions   Hydrocodone  Nausea And Vomiting and Other (See Comments)    Headache, dizziness    Pollen Extract Other (See Comments)    Respiratory symptoms   Tramadol  Nausea And Vomiting and Other (See Comments)    Headache, dizziness    FAMILY HISTORY:  family history includes Aneurysm in his father; Heart attack in his father; Heart disease in his  maternal grandmother. SOCIAL HISTORY:  reports that he quit smoking about 19 years ago. His smoking use included cigarettes. He has never used smokeless tobacco. He reports current alcohol use of about 7.0 standard drinks of alcohol per week. He reports that he does not use drugs.   Review of Systems:  Gen:  Denies  fever, sweats, chills weight loss  HEENT: Denies blurred vision, double vision, ear pain, eye pain, hearing loss, nose bleeds, sore throat Cardiac:  No dizziness, chest pain or heaviness, chest tightness,edema, No JVD Resp:   No cough, -sputum production, -shortness of breath,-wheezing, -hemoptysis,  Gi: Denies swallowing difficulty, stomach pain, nausea or vomiting, diarrhea, constipation, bowel incontinence Gu:  Denies bladder incontinence, burning urine Ext:   Denies Joint pain, stiffness or swelling Skin: Denies  skin rash, easy bruising or bleeding or hives Endoc:  Denies polyuria, polydipsia , polyphagia or weight change Psych:   Denies depression, insomnia or hallucinations  Other:  All other systems negative  VITAL SIGNS: BP 120/80   Pulse 65   Temp 97.6 F (36.4 C)   Ht 6' (1.829 m)   Wt 192 lb (87.1 kg)   SpO2 98%   BMI 26.04 kg/m    Physical Examination:   General Appearance: No distress  EYES PERRLA, EOM intact.   NECK Supple, No JVD Pulmonary: normal breath sounds, No wheezing.  CardiovascularNormal S1,S2.  No m/r/g.   Abdomen: Benign, Soft, non-tender. Skin:   warm, no rashes, no ecchymosis  Extremities: normal, no cyanosis, clubbing. Neuro:without focal findings,  speech normal  PSYCHIATRIC: Mood, affect within normal limits.   ASSESSMENT AND PLAN  OSA I suspect that OSA is likely present due to clinical presentation. Discussed the consequences of untreated sleep apnea. Advised not to drive drowsy for safety of patient and others. Will complete further evaluation with a home sleep study and follow up to review results.    Atrial  flutter Stable, following with Cardiology.    MEDICATION ADJUSTMENTS/LABS AND TESTS ORDERED: Recommend Sleep Study   Patient  satisfied with Plan of action and management. All questions answered  Follow up to review HST results and treatment plan.   I spent a total of 50 minutes reviewing chart data, face-to-face evaluation with the patient, counseling and coordination of care as detailed above.    Emmali Karow, M.D.  Sleep Medicine [facility]

## 2024-01-28 ENCOUNTER — Encounter

## 2024-01-28 DIAGNOSIS — G4733 Obstructive sleep apnea (adult) (pediatric): Secondary | ICD-10-CM

## 2024-02-08 DIAGNOSIS — G4733 Obstructive sleep apnea (adult) (pediatric): Secondary | ICD-10-CM | POA: Diagnosis not present

## 2024-02-08 DIAGNOSIS — R0683 Snoring: Secondary | ICD-10-CM | POA: Diagnosis not present

## 2024-02-10 ENCOUNTER — Ambulatory Visit: Payer: Self-pay

## 2024-02-20 NOTE — Progress Notes (Unsigned)
 " Electrophysiology Office Note:   Date:  02/22/2024  ID:  Christopher Aguirre, DOB [birth year], MRN [id number]  Primary Cardiologist: Deatrice Cage, MD Electrophysiologist: Fonda Kitty, MD      History of Present Illness:   Christopher Aguirre is a 84 y.o. male with h/o  atrial flutter, PVC, previous smoker who is being seen today for AFL management.  Discussed the use of AI scribe software for clinical note transcription with the patient, who gave verbal consent to proceed.  History of Present Illness Christopher Aguirre is an 84 year old male with atrial flutter who presents for evaluation following a cardioversion procedure. He was referred by Dr. Liberty for evaluation of his atrial flutter episode.  He experienced an episode of atrial flutter, which was treated with a cardioversion procedure to restore normal heart rhythm. No change in energy levels or symptoms was noted before or after the procedure, and he stated he 'didn't feel different before.'  He has been on Eliquis  to prevent stroke due to the risk associated with atrial flutter and atrial fibrillation. He has been in normal rhythm since the cardioversion, which occurred approximately two months ago, around December 29, 2023.  He wore a heart monitor in November, which showed he remained in normal rhythm during that period.  Reports feeling relatively well today with no new or acute complaints.    Review of systems complete and found to be negative unless listed in HPI.   EP Information / Studies Reviewed:    EKG is ordered today. Personal review as below.  EKG Interpretation Date/Time:  Tuesday February 21 2024 10:28:31 EST Ventricular Rate:  62 PR Interval:  324 QRS Duration:  134 QT Interval:  450 QTC Calculation: 456 R Axis:   -75  Text Interpretation: Sinus rhythm with 1st degree A-V block Right bundle branch block Left anterior fascicular block When compared with ECG of 29-Dec-2023 07:41, Blocked PACs and PVCs no  longer present. Confirmed by Kitty Fonda [phone]) on 02/22/2024 8:41:13 AM   ECG 12/29/23: Typical AFL   Zio 12/05/23:  Patch Wear Time:  13 days and 23 hours (2025-10-04T17:58:38-0400 to 2025-10-18T17:58:30-0400)   2 Ventricular Tachycardia runs occurred, the run with the fastest interval lasting 5 beats with a max rate of 164 bpm, the longest lasting 7 beats with an avg rate of 146 bpm. Atrial Flutter occurred continuously (100% burden), ranging from 31-128 bpm  (avg of 64 bpm). Bundle Branch Block/IVCD was present. 7 Pauses occurred, the longest lasting 3 secs (20 bpm).   Echo 01/09/24:   1. Left ventricular ejection fraction, by estimation, is 55 to 60%. The  left ventricle has normal function. The left ventricle has no regional  wall motion abnormalities. There is mild left ventricular hypertrophy.  Left ventricular diastolic parameters  are consistent with Grade I diastolic dysfunction (impaired relaxation).   2. Right ventricular systolic function is normal. The right ventricular  size is normal.   3. Left atrial size was mildly dilated.   4. The mitral valve is normal in structure. Mild mitral valve  regurgitation.   5. The aortic valve is tricuspid. Aortic valve regurgitation is not  visualized. Aortic valve sclerosis/calcification is present, without any  evidence of aortic stenosis.   6. Aortic dilatation noted. There is borderline dilatation of the aortic  root, measuring 38 mm.   Risk Assessment/Calculations:    CHA2DS2-VASc Score = 2   This indicates a 2.2% annual risk of stroke. The patient's score is based  upon: CHF History: 0 HTN History: 0 Diabetes History: 0 Stroke History: 0 Vascular Disease History: 0 Age Score: 2 Gender Score: 0        STOP-Bang Score:  4       Physical Exam:   VS:  BP 130/78 (BP Location: Left Arm, Patient Position: Sitting, Cuff Size: Normal)   Pulse 62   Ht 6' (1.829 m)   Wt 194 lb 3.2 oz (88.1 kg)   SpO2 97%   BMI 26.34  kg/m    Wt Readings from Last 3 Encounters:  02/21/24 194 lb 3.2 oz (88.1 kg)  01/11/24 192 lb (87.1 kg)  12/29/23 195 lb (88.5 kg)     General: Well developed, in no acute distress.  Neck: No JVD.  Cardiac: Normal rate, regular rhythm.  Resp: Normal work of breathing.  Ext: No edema.  Neuro: No gross focal deficits.  Psych: Normal affect.    ASSESSMENT AND PLAN:    #Typical atrial flutter: 1 known episode, unknown duration, ultimately requiring cardioversion. He has maintained sinus rhythm since. Relatively asymptomatic. 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 Patient would favor a more conservative approach at this time. We discussed implanting a loop recorder to monitor for AF/AFL and if no recurrence then discontinuing anti-coagulation given low CHADSVASC score. ILR would also serve to monitor his bradycardia in setting of advanced conduction disease. Patient preferred this option. We will submit for insurance approval for loop recorder.  - Continue Eliquis  for now.   #Bradycardia #RBBB #LAFB #First degree AV block - Appears to be asymptomatic and chronotropically competent currently. We discussed role of loop recorder for monitoring for progression of conduction disease and to correlate any symptoms related to bradycardia. We will also use this to monitor for AF, which he has not yet had, and recurrence of AF.   #Nocturnal pauses #Sleep disordered breathing High concern for OSA given nocturnal pauses during sleep Stop bang score = 4 Referred to pulm for further eval/treatment. He is hesitant to consider CPAP, but willing to discuss further with pulm.   Follow up with Dr. Kennyth for ILR implant.   Signed, Fonda Kennyth, MD  "

## 2024-02-21 ENCOUNTER — Encounter: Payer: Self-pay | Admitting: Cardiology

## 2024-02-21 ENCOUNTER — Ambulatory Visit: Attending: Cardiology | Admitting: Cardiology

## 2024-02-21 VITALS — BP 130/78 | HR 62 | Ht 72.0 in | Wt 194.2 lb

## 2024-02-21 DIAGNOSIS — R001 Bradycardia, unspecified: Secondary | ICD-10-CM | POA: Insufficient documentation

## 2024-02-21 DIAGNOSIS — I444 Left anterior fascicular block: Secondary | ICD-10-CM | POA: Diagnosis not present

## 2024-02-21 DIAGNOSIS — I4892 Unspecified atrial flutter: Secondary | ICD-10-CM

## 2024-02-21 DIAGNOSIS — D6869 Other thrombophilia: Secondary | ICD-10-CM | POA: Insufficient documentation

## 2024-02-21 DIAGNOSIS — I483 Typical atrial flutter: Secondary | ICD-10-CM | POA: Insufficient documentation

## 2024-02-21 DIAGNOSIS — I440 Atrioventricular block, first degree: Secondary | ICD-10-CM | POA: Diagnosis not present

## 2024-02-21 DIAGNOSIS — I4510 Unspecified right bundle-branch block: Secondary | ICD-10-CM | POA: Insufficient documentation

## 2024-02-21 DIAGNOSIS — G4730 Sleep apnea, unspecified: Secondary | ICD-10-CM | POA: Diagnosis not present

## 2024-02-21 DIAGNOSIS — R0789 Other chest pain: Secondary | ICD-10-CM

## 2024-02-21 NOTE — Patient Instructions (Signed)
 Medication Instructions:  Your physician recommends that you continue on your current medications as directed. Please refer to the Current Medication list given to you today.  *If you need a refill on your cardiac medications before your next appointment, please call your pharmacy*  Testing/Procedures: Loop Recorder Implant - this will be done at your next office visit. There are no restrictions or special instructions prior to this visit. Please note that you will not be able to shower for 72 hours after the procedure.   Follow-Up: At [facility], you and your health needs are our priority.  As part of our continuing mission to provide you with exceptional heart care, our providers are all part of one team.  This team includes your primary Cardiologist (physician) and Advanced Practice Providers or APPs (Physician Assistants and Nurse Practitioners) who all work together to provide you with the care you need, when you need it.  Your next appointment:

## 2024-04-03 ENCOUNTER — Ambulatory Visit: Admitting: Urology

## 2024-04-05 ENCOUNTER — Ambulatory Visit: Payer: Medicare Other | Admitting: Urology

## 2024-04-17 ENCOUNTER — Ambulatory Visit: Admitting: Cardiology
